# Patient Record
Sex: Male | Born: 1945 | Race: White | Hispanic: No | Marital: Married | Smoking: Former smoker
Health system: Southern US, Community
[De-identification: ages and names within clinical notes are randomized; demographics above are authoritative.]

## PROBLEM LIST (undated history)

## (undated) DIAGNOSIS — F0280 Dementia in other diseases classified elsewhere without behavioral disturbance: Secondary | ICD-10-CM

## (undated) DIAGNOSIS — G309 Alzheimer's disease, unspecified: Secondary | ICD-10-CM

## (undated) DIAGNOSIS — G20 Parkinson's disease: Secondary | ICD-10-CM

## (undated) DIAGNOSIS — G20A1 Parkinson's disease without dyskinesia, without mention of fluctuations: Secondary | ICD-10-CM

## (undated) DIAGNOSIS — N210 Calculus in bladder: Secondary | ICD-10-CM

## (undated) DIAGNOSIS — D649 Anemia, unspecified: Secondary | ICD-10-CM

## (undated) DIAGNOSIS — N400 Enlarged prostate without lower urinary tract symptoms: Secondary | ICD-10-CM

## (undated) DIAGNOSIS — F0390 Unspecified dementia without behavioral disturbance: Secondary | ICD-10-CM

## (undated) DIAGNOSIS — I10 Essential (primary) hypertension: Secondary | ICD-10-CM

## (undated) DIAGNOSIS — E785 Hyperlipidemia, unspecified: Secondary | ICD-10-CM

## (undated) HISTORY — PX: RECTAL SURGERY: SHX760

---

## 2012-10-31 ENCOUNTER — Emergency Department (HOSPITAL_COMMUNITY): Payer: Medicare Other

## 2012-10-31 ENCOUNTER — Encounter (HOSPITAL_COMMUNITY): Payer: Self-pay

## 2012-10-31 ENCOUNTER — Emergency Department (HOSPITAL_COMMUNITY)
Admission: EM | Admit: 2012-10-31 | Discharge: 2012-10-31 | Disposition: A | Discharge status: Home / Self Care | Payer: Medicare Other | Attending: Emergency Medicine | Admitting: Emergency Medicine

## 2012-10-31 DIAGNOSIS — I10 Essential (primary) hypertension: Secondary | ICD-10-CM | POA: Insufficient documentation

## 2012-10-31 DIAGNOSIS — F0280 Dementia in other diseases classified elsewhere without behavioral disturbance: Secondary | ICD-10-CM | POA: Insufficient documentation

## 2012-10-31 DIAGNOSIS — Z23 Encounter for immunization: Secondary | ICD-10-CM | POA: Insufficient documentation

## 2012-10-31 DIAGNOSIS — S0100XA Unspecified open wound of scalp, initial encounter: Secondary | ICD-10-CM | POA: Insufficient documentation

## 2012-10-31 DIAGNOSIS — Z862 Personal history of diseases of the blood and blood-forming organs and certain disorders involving the immune mechanism: Secondary | ICD-10-CM | POA: Insufficient documentation

## 2012-10-31 DIAGNOSIS — W1809XA Striking against other object with subsequent fall, initial encounter: Secondary | ICD-10-CM

## 2012-10-31 DIAGNOSIS — S0101XA Laceration without foreign body of scalp, initial encounter: Secondary | ICD-10-CM

## 2012-10-31 DIAGNOSIS — W06XXXA Fall from bed, initial encounter: Secondary | ICD-10-CM | POA: Insufficient documentation

## 2012-10-31 DIAGNOSIS — Y929 Unspecified place or not applicable: Secondary | ICD-10-CM | POA: Insufficient documentation

## 2012-10-31 DIAGNOSIS — Y939 Activity, unspecified: Secondary | ICD-10-CM | POA: Insufficient documentation

## 2012-10-31 DIAGNOSIS — Z87448 Personal history of other diseases of urinary system: Secondary | ICD-10-CM | POA: Insufficient documentation

## 2012-10-31 DIAGNOSIS — Z8639 Personal history of other endocrine, nutritional and metabolic disease: Secondary | ICD-10-CM | POA: Insufficient documentation

## 2012-10-31 DIAGNOSIS — G309 Alzheimer's disease, unspecified: Secondary | ICD-10-CM | POA: Insufficient documentation

## 2012-10-31 HISTORY — DX: Essential (primary) hypertension: I10

## 2012-10-31 HISTORY — DX: Benign prostatic hyperplasia without lower urinary tract symptoms: N40.0

## 2012-10-31 HISTORY — DX: Alzheimer's disease, unspecified: G30.9

## 2012-10-31 HISTORY — DX: Dementia in other diseases classified elsewhere, unspecified severity, without behavioral disturbance, psychotic disturbance, mood disturbance, and anxiety: F02.80

## 2012-10-31 HISTORY — DX: Hyperlipidemia, unspecified: E78.5

## 2012-10-31 HISTORY — DX: Unspecified dementia, unspecified severity, without behavioral disturbance, psychotic disturbance, mood disturbance, and anxiety: F03.90

## 2012-10-31 MED ORDER — LIDOCAINE HCL (PF) 1 % IJ SOLN
10.0000 mL | Freq: Once | INTRAMUSCULAR | Status: AC
  Administered 2012-10-31: 10 mL via INTRADERMAL
  Filled 2012-10-31 (×2): qty 5

## 2012-10-31 MED ORDER — ACETAMINOPHEN 325 MG PO TABS
650.0000 mg | ORAL_TABLET | Freq: Once | ORAL | Status: AC
  Administered 2012-10-31: 650 mg via ORAL
  Filled 2012-10-31: qty 2

## 2012-10-31 MED ORDER — BACITRACIN 500 UNIT/GM EX OINT
1.0000 "application " | TOPICAL_OINTMENT | Freq: Two times a day (BID) | CUTANEOUS | Status: DC
  Administered 2012-10-31: 1 via TOPICAL
  Filled 2012-10-31 (×5): qty 0.9

## 2012-10-31 MED ORDER — TETANUS-DIPHTH-ACELL PERTUSSIS 5-2.5-18.5 LF-MCG/0.5 IM SUSP
0.5000 mL | Freq: Once | INTRAMUSCULAR | Status: AC
  Administered 2012-10-31: 0.5 mL via INTRAMUSCULAR
  Filled 2012-10-31: qty 0.5

## 2012-10-31 NOTE — ED Notes (Signed)
Laceration to forehead, cleaned with saline, wrapped with 4X4 and gauze, pt tolerated well.

## 2012-10-31 NOTE — ED Notes (Signed)
Pt presents to er with family with c/o fall, laceration to left posterior head area, bleeding controlled at present, pt is a resident of [facility], wife states that she was told by staff members that pt was found walking out of the door of his room, staff found blood stain on night dresser, thinks that pt may have rolled out of bed, pt has hx of alzheimer's. pt alert to baseline, verified with family at bedside, .

## 2012-10-31 NOTE — ED Notes (Signed)
Pt a resident of [facility] in [place], wife was told that pt fell out of bed, pt denies any loc, pt stated he got tangled in the sheets.

## 2012-10-31 NOTE — ED Notes (Signed)
Tammy PA at bedside,  

## 2012-10-31 NOTE — ED Provider Notes (Signed)
History     CSN: [id number]  Arrival date & time 10/31/12  1533   First MD Initiated Contact with Patient 10/31/12 [phone]      Chief Complaint  Patient presents with  . Head Laceration  . Fall    (Consider location/radiation/quality/duration/timing/severity/associated sxs/prior treatment) HPI Comments: Level 5 caveat applied  Patient with hx of dementia and Alzheimer's and resides at an assisted living facility, c/o laceration to his left scalp after falling out of bed this morning. Patient is unsure of what he hit his head on, patient's wife states that she was informed by the facility staff, that the staff noticed him standing outside his room and blood on his scalp.  His wife states that he has been answering questions appropriately and ambulating without difficulty since the accident.  She states that he "is acting normal for him".  Patient denies headache, dizziness, neck pain, vomiting, visual changes, back pain, hip pain or other injuries.   Wife is unsure of last TDaP.  She states that he answers questions appropriately, but most answers are "yes" and "no".    Patient is a 67 y.o. male presenting with scalp laceration. The history is provided by the patient and the spouse. History limited by: hx of dementia, alzheimer's.  Head Laceration This is a new problem. The current episode started today. The problem occurs constantly. The problem has been unchanged. Nothing aggravates the symptoms. He has tried nothing for the symptoms. The treatment provided no relief.    Past Medical History  Diagnosis Date  . Alzheimer disease   . Hypertension   . Dementia   . BPH (benign prostatic hyperplasia)   . HLD (hyperlipidemia)     Past Surgical History  Procedure Laterality Date  . Rectal surgery      No family history on file.  History  Substance Use Topics  . Smoking status: Never Smoker   . Smokeless tobacco: Not on file  . Alcohol Use: No      Review of Systems  Unable  to perform ROS Due to level 5 caveat  Allergies  Review of patient's allergies indicates not on file.  Home Medications  No current outpatient prescriptions on file.  BP 153/77  Pulse 75  Temp(Src) 98.9 F (37.2 C) (Oral)  Resp 17  Ht 5\' 7"  (1.702 m)  Wt 120 lb (54.432 kg)  BMI 18.79 kg/m2  SpO2 99%  Physical Exam  Nursing note and vitals reviewed. Constitutional: He is oriented to person, place, and time. He appears well-developed and well-nourished. No distress.  HENT:  Head:    Eyes: Conjunctivae and EOM are normal. Pupils are equal, round, and reactive to light.  Neck: Normal range of motion. Neck supple.  Cardiovascular: Normal rate, regular rhythm, normal heart sounds and intact distal pulses.   No murmur heard. Pulmonary/Chest: Effort normal and breath sounds normal. No respiratory distress. He exhibits no tenderness.  Abdominal: Soft. He exhibits no distension and no mass. There is no tenderness. There is no rebound and no guarding.  Musculoskeletal: Normal range of motion. He exhibits no edema and no tenderness.  Lymphadenopathy:    He has no cervical adenopathy.  Neurological: He is alert and oriented to person, place, and time. He has normal strength. No cranial nerve deficit or sensory deficit. He exhibits normal muscle tone. Coordination and gait normal.  Skin: Skin is warm and dry.  Psychiatric: He has a normal mood and affect.    ED Course  Procedures (including critical  care time)  Labs Reviewed - No data to display No results found.   Ct Head Wo Contrast  10/31/2012  *RADIOLOGY REPORT*  Clinical Data: Dementia.  High blood pressure.  Fall.  CT HEAD WITHOUT CONTRAST  Technique:  Contiguous axial images were obtained from the base of the skull through the vertex without contrast.  Comparison: None.  Findings: No skull fracture or intracranial hemorrhage.  Global atrophy.  Ventricular prominence I suspect is related to atrophy.  Difficult to completely  exclude a mild component hydrocephalus.  Small vessel disease type changes without CT evidence of large acute infarct.  No intracranial mass lesion detected on this unenhanced exam.  Left maxillary sinus and left sphenoid sinus mucosal thickening.  IMPRESSION: No skull fracture or intracranial hemorrhage.  Global atrophy.  Ventricular prominence I suspect is related to atrophy.  Difficult to completely exclude a mild component hydrocephalus.  Left maxillary sinus and left sphenoid sinus mucosal thickening.   Original Report Authenticated By: Lacy Duverney, M.D.      MDM    LACERATION REPAIR Performed by: Flemon Kelty L. Authorized by: Maxwell Caul Consent: Verbal consent obtained. Risks and benefits: risks, benefits and alternatives were discussed Consent given by: patient Patient identity confirmed: provided demographic data Prepped and Draped in normal sterile fashion Wound explored  Laceration Location: left scalp  Laceration Length: 4 cm  No Foreign Bodies seen or palpated  Anesthesia: local infiltration  Local anesthetic: lidocaine 1% w/o epinephrine  Anesthetic total: 4 ml  Irrigation method: syringe Amount of cleaning: standard  Skin closure:staples Number of staples: 7   Patient tolerance: Patient tolerated the procedure well with no immediate complications.    Patient remains alert, moving all extremities w/o difficulty.  At his neurological baseline per his wife.     Patient seen by EDP, Dr. Adriana Simas prior to d/c.  Spouse agrees to close f/u with his PMD or to return here if needed.    The patient appears reasonably screened and/or stabilized for discharge and I doubt any other medical condition or other [facility] requiring further screening, evaluation, or treatment in the ED at this time prior to discharge.   Kate Sweetman L. Trisha Mangle, PA-C 11/03/12 1605

## 2012-10-31 NOTE — ED Notes (Addendum)
Pt given drink and crackers  

## 2012-11-07 NOTE — ED Provider Notes (Signed)
Medical screening examination/treatment/procedure(s) were conducted as a shared visit with non-physician practitioner(s) and myself.  I personally evaluated the patient during the encounter.  5 caveat for dementia. No new neurological deficits. Laceration repair by Pauline Aus PA  Donnetta Hutching, MD 11/07/12 [phone]

## 2013-08-04 ENCOUNTER — Encounter (HOSPITAL_COMMUNITY): Payer: Self-pay | Admitting: Emergency Medicine

## 2013-08-04 ENCOUNTER — Emergency Department (HOSPITAL_COMMUNITY): Payer: Medicare Other

## 2013-08-04 ENCOUNTER — Emergency Department (HOSPITAL_COMMUNITY)
Admission: EM | Admit: 2013-08-04 | Discharge: 2013-08-04 | Disposition: A | Discharge status: Home / Self Care | Payer: Medicare Other | Attending: Emergency Medicine | Admitting: Emergency Medicine

## 2013-08-04 DIAGNOSIS — H5789 Other specified disorders of eye and adnexa: Secondary | ICD-10-CM | POA: Insufficient documentation

## 2013-08-04 DIAGNOSIS — S0083XA Contusion of other part of head, initial encounter: Secondary | ICD-10-CM

## 2013-08-04 DIAGNOSIS — IMO0002 Reserved for concepts with insufficient information to code with codable children: Secondary | ICD-10-CM | POA: Insufficient documentation

## 2013-08-04 DIAGNOSIS — Z7982 Long term (current) use of aspirin: Secondary | ICD-10-CM | POA: Insufficient documentation

## 2013-08-04 DIAGNOSIS — R000 Tachycardia, unspecified: Secondary | ICD-10-CM | POA: Insufficient documentation

## 2013-08-04 DIAGNOSIS — Z8639 Personal history of other endocrine, nutritional and metabolic disease: Secondary | ICD-10-CM | POA: Insufficient documentation

## 2013-08-04 DIAGNOSIS — G309 Alzheimer's disease, unspecified: Secondary | ICD-10-CM | POA: Insufficient documentation

## 2013-08-04 DIAGNOSIS — Z79899 Other long term (current) drug therapy: Secondary | ICD-10-CM | POA: Insufficient documentation

## 2013-08-04 DIAGNOSIS — S0003XA Contusion of scalp, initial encounter: Secondary | ICD-10-CM | POA: Insufficient documentation

## 2013-08-04 DIAGNOSIS — F0280 Dementia in other diseases classified elsewhere without behavioral disturbance: Secondary | ICD-10-CM | POA: Insufficient documentation

## 2013-08-04 DIAGNOSIS — W19XXXA Unspecified fall, initial encounter: Secondary | ICD-10-CM

## 2013-08-04 DIAGNOSIS — Y92009 Unspecified place in unspecified non-institutional (private) residence as the place of occurrence of the external cause: Secondary | ICD-10-CM | POA: Insufficient documentation

## 2013-08-04 DIAGNOSIS — R296 Repeated falls: Secondary | ICD-10-CM | POA: Insufficient documentation

## 2013-08-04 DIAGNOSIS — Z87448 Personal history of other diseases of urinary system: Secondary | ICD-10-CM | POA: Insufficient documentation

## 2013-08-04 DIAGNOSIS — I10 Essential (primary) hypertension: Secondary | ICD-10-CM | POA: Insufficient documentation

## 2013-08-04 DIAGNOSIS — S1093XA Contusion of unspecified part of neck, initial encounter: Principal | ICD-10-CM

## 2013-08-04 DIAGNOSIS — Z862 Personal history of diseases of the blood and blood-forming organs and certain disorders involving the immune mechanism: Secondary | ICD-10-CM | POA: Insufficient documentation

## 2013-08-04 DIAGNOSIS — Y939 Activity, unspecified: Secondary | ICD-10-CM | POA: Insufficient documentation

## 2013-08-04 LAB — BASIC METABOLIC PANEL
BUN: 31 mg/dL — ABNORMAL HIGH (ref 6–23)
CO2: 28 meq/L (ref 19–32)
Calcium: 10.3 mg/dL (ref 8.4–10.5)
Chloride: 101 mEq/L (ref 96–112)
Creatinine, Ser: 1.21 mg/dL (ref 0.50–1.35)
GFR calc Af Amer: 70 mL/min — ABNORMAL LOW (ref >90)
GFR calc non Af Amer: 60 mL/min — ABNORMAL LOW (ref >90)
GLUCOSE: 124 mg/dL — AB (ref 70–99)
POTASSIUM: 3.9 meq/L (ref 3.7–5.3)
SODIUM: 143 meq/L (ref 137–147)

## 2013-08-04 LAB — URINALYSIS, ROUTINE W REFLEX MICROSCOPIC
Bilirubin Urine: NEGATIVE
GLUCOSE, UA: NEGATIVE mg/dL
HGB URINE DIPSTICK: NEGATIVE
Leukocytes, UA: NEGATIVE
Nitrite: NEGATIVE
PH: 5 (ref 5.0–8.0)
Protein, ur: NEGATIVE mg/dL
UROBILINOGEN UA: 0.2 mg/dL (ref 0.0–1.0)

## 2013-08-04 LAB — CBC
HEMATOCRIT: 42.9 % (ref 39.0–52.0)
HEMOGLOBIN: 16 g/dL (ref 13.0–17.0)
MCH: 32.5 pg (ref 26.0–34.0)
MCHC: 37.3 g/dL — ABNORMAL HIGH (ref 30.0–36.0)
MCV: 87.2 fL (ref 78.0–100.0)
Platelets: 95 10*3/uL — ABNORMAL LOW (ref 150–400)
RBC: 4.92 MIL/uL (ref 4.22–5.81)
RDW: 14.3 % (ref 11.5–15.5)
WBC: 11.1 10*3/uL — ABNORMAL HIGH (ref 4.0–10.5)

## 2013-08-04 LAB — POCT I-STAT TROPONIN I: TROPONIN I, POC: 0.01 ng/mL (ref 0.00–0.08)

## 2013-08-04 MED ORDER — DOUBLE ANTIBIOTIC 500-10000 UNIT/GM EX OINT
TOPICAL_OINTMENT | CUTANEOUS | Status: AC
  Filled 2013-08-04: qty 1

## 2013-08-04 MED ORDER — DOUBLE ANTIBIOTIC 500-10000 UNIT/GM EX OINT
TOPICAL_OINTMENT | Freq: Once | CUTANEOUS | Status: AC
  Administered 2013-08-04: 1 via TOPICAL

## 2013-08-04 NOTE — ED Provider Notes (Signed)
Medical screening examination/treatment/procedure(s) were conducted as a shared visit with non-physician practitioner(s) and myself.  I personally evaluated the patient during the encounter.  EKG Interpretation    Date/Time:  Thursday August 04 2013 12:06:30 EST Ventricular Rate:  110 PR Interval:  152 QRS Duration: 76 QT Interval:  320 QTC Calculation: 433 R Axis:   86 Text Interpretation:  Sinus tachycardia Otherwise normal ECG No previous ECGs available Confirmed by Bebe ShaggyWICKLINE  MD, Marcanthony Sleight [phone]) on 08/04/2013 12:12:45 PM             Pt without any signs of traumatic extremity/spinal injury Stable for d/c Suspect mechanical fall   Joya Gaskinsonald W Allessandra Bernardi, MD 08/04/13 [phone]

## 2013-08-04 NOTE — ED Notes (Signed)
[facility] and facial injury

## 2013-08-04 NOTE — ED Provider Notes (Signed)
CSN: [id number]     Arrival date & time 08/04/13  1058 History   First MD Initiated Contact with Patient 08/04/13 1145     Chief Complaint  Patient presents with  . Fall   (Consider location/radiation/quality/duration/timing/severity/associated sxs/prior Treatment) HPI Comments: Patient is a 68 year old male with a past medical history of Alzheimer's dementia and hypertension who presents to the emergency department from [facility] after a fall. Patient had an unwitnessed fall this morning, unsure at what time, he was found on the floor by the assisted living staff conscious. It was noted that he had abrasions to his face. Wife states patient is at his baseline mentation. Patient unable to provide any information to the history, level V caveat due to dementia.  Patient is a 68 y.o. male presenting with fall. The history is provided by the spouse.  Fall    Past Medical History  Diagnosis Date  . Alzheimer disease   . Hypertension   . Dementia   . BPH (benign prostatic hyperplasia)   . HLD (hyperlipidemia)    Past Surgical History  Procedure Laterality Date  . Rectal surgery     No family history on file. History  Substance Use Topics  . Smoking status: Never Smoker   . Smokeless tobacco: Not on file  . Alcohol Use: No    Review of Systems  Unable to perform ROS: Dementia    Allergies  Review of patient's allergies indicates no known allergies.  Home Medications   Current Outpatient Rx  Name  Route  Sig  Dispense  Refill  . ALPRAZolam (XANAX) 0.5 MG tablet   Oral   Take 0.5 mg by mouth 2 (two) times daily.          Marland Kitchen. aspirin EC 81 MG tablet   Oral   Take 81 mg by mouth daily.         . B Complex-Biotin-FA (B-COMPLEX PO)   Oral   Take 1 tablet by mouth daily.         . busPIRone (BUSPAR) 15 MG tablet   Oral   Take 15 mg by mouth 2 (two) times daily.         . carbidopa-levodopa (SINEMET IR) 25-100 MG per tablet   Oral   Take 1 tablet by mouth 3  (three) times daily.         Marland Kitchen. galantamine (RAZADYNE) 4 MG tablet   Oral   Take 4 mg by mouth 2 (two) times daily.         . memantine (NAMENDA) 10 MG tablet   Oral   Take 20 mg by mouth daily.         . mirtazapine (REMERON) 15 MG tablet   Oral   Take 15 mg by mouth at bedtime.         Marland Kitchen. OLANZapine (ZYPREXA) 5 MG tablet   Oral   Take 5 mg by mouth daily.         . Omega-3 Fatty Acids (FISH OIL) 1200 MG CAPS   Oral   Take 1 capsule by mouth daily.         . sertraline (ZOLOFT) 50 MG tablet   Oral   Take 50 mg by mouth daily.         . vitamin C (ASCORBIC ACID) 500 MG tablet   Oral   Take 500 mg by mouth daily.         . vitamin E 400 UNIT capsule  Oral   Take 400 Units by mouth daily.          BP 126/79  Pulse 109  Temp(Src) 99.3 F (37.4 C) (Oral)  Resp 18  SpO2 98% Physical Exam  Nursing note and vitals reviewed. Constitutional: He appears well-developed and well-nourished. No distress.  HENT:  Head: Normocephalic. Head is without raccoon's eyes and without Battle's sign.    Right Ear: No hemotympanum.  Left Ear: No hemotympanum.  Nose: Nose normal.  Mouth/Throat: Oropharynx is clear and moist.  Multiple facial abrasions, tender.  Eyes: Conjunctivae are normal. Right eye exhibits discharge (white/clear). Left eye exhibits discharge (white/clear).  Neck: Normal range of motion. Neck supple.  Cardiovascular: Regular rhythm, normal heart sounds and intact distal pulses.  Tachycardia present.   No extremity edema.  Pulmonary/Chest: Effort normal and breath sounds normal.  Abdominal: Soft. Bowel sounds are normal. He exhibits no distension. There is no tenderness.  Musculoskeletal: Normal range of motion. He exhibits no edema.  Neurological: He is alert.  Skin: Skin is warm and dry. He is not diaphoretic.  Psychiatric: He has a normal mood and affect. His behavior is normal.    ED Course  Procedures (including critical care time) Labs  Review Labs Reviewed  CBC - Abnormal; Notable for the following:    WBC 11.1 (*)    MCHC 37.3 (*)    Platelets 95 (*)    All other components within normal limits  BASIC METABOLIC PANEL - Abnormal; Notable for the following:    Glucose, Bld 124 (*)    BUN 31 (*)    GFR calc non Af Amer 60 (*)    GFR calc Af Amer 70 (*)    All other components within normal limits  URINALYSIS, ROUTINE W REFLEX MICROSCOPIC - Abnormal; Notable for the following:    Specific Gravity, Urine >1.030 (*)    Ketones, ur TRACE (*)    All other components within normal limits  POCT I-STAT TROPONIN I   Imaging Review Dg Chest 1 View  08/04/2013   CLINICAL DATA:  Hypertension.  Status post fall.  EXAM: CHEST - 1 VIEW  COMPARISON:  None.  FINDINGS: The heart size and mediastinal contours are within normal limits. There is no focal infiltrate, pulmonary edema, or pleural effusion. 5 mm calcified granuloma is identified in the right mid lung. There is scoliosis of spine. The visualized bony structures otherwise unremarkable.  IMPRESSION: No active cardiopulmonary disease.   Electronically Signed   By: Sherian Rein M.D.   On: 08/04/2013 12:42   Ct Head Wo Contrast  08/04/2013   CLINICAL DATA:  Fall  EXAM: CT HEAD WITHOUT CONTRAST  CT MAXILLOFACIAL WITHOUT CONTRAST  CT CERVICAL SPINE WITHOUT CONTRAST  TECHNIQUE: Multidetector CT imaging of the head, cervical spine, and maxillofacial structures were performed using the standard protocol without intravenous contrast. Multiplanar CT image reconstructions of the cervical spine and maxillofacial structures were also generated.  COMPARISON:  CT HEAD W/O CM dated 10/31/2012  FINDINGS: CT HEAD FINDINGS  Global atrophy. Chronic ischemic changes in the periventricular white matter are mild. No mass effect, midline shift come off or acute intracranial hemorrhage. Cranium is intact.  CT MAXILLOFACIAL FINDINGS  Lytic bone lesion at the anterior right middle cranial fossa is stable. No acute  fracture. No dislocation. Periapical lucency surrounding a left upper molar is noted compatible with periodontal disease. Mastoid air cells are clear. Paranasal sinuses are clear. No evidence of orbital or vitreous hemorrhage. Nasal septum is  deviated to the right, a chronic finding.  CT CERVICAL SPINE FINDINGS  No acute fracture.  No dislocation.  No obvious soft tissue injury. Thyroid is heterogeneous without focal mass. Lung apices clear.  Severe disc space narrowing at C4-5 with moderate right foraminal narrowing.  Posterior osteophytic ridging at C5-6 causes right-sided central stenosis and mild right foraminal narrowing.  C6-7: Posterior osteophytes and severe disc space narrowing without foraminal or central narrowing.  IMPRESSION: No acute intracranial pathology.  No evidence of facial bone fracture.  No acute cervical spine injury.  Degenerative changes are noted.   Electronically Signed   By: Maryclare Bean M.D.   On: 08/04/2013 13:05   Ct Cervical Spine Wo Contrast  08/04/2013   CLINICAL DATA:  Fall  EXAM: CT HEAD WITHOUT CONTRAST  CT MAXILLOFACIAL WITHOUT CONTRAST  CT CERVICAL SPINE WITHOUT CONTRAST  TECHNIQUE: Multidetector CT imaging of the head, cervical spine, and maxillofacial structures were performed using the standard protocol without intravenous contrast. Multiplanar CT image reconstructions of the cervical spine and maxillofacial structures were also generated.  COMPARISON:  CT HEAD W/O CM dated 10/31/2012  FINDINGS: CT HEAD FINDINGS  Global atrophy. Chronic ischemic changes in the periventricular white matter are mild. No mass effect, midline shift come off or acute intracranial hemorrhage. Cranium is intact.  CT MAXILLOFACIAL FINDINGS  Lytic bone lesion at the anterior right middle cranial fossa is stable. No acute fracture. No dislocation. Periapical lucency surrounding a left upper molar is noted compatible with periodontal disease. Mastoid air cells are clear. Paranasal sinuses are clear. No  evidence of orbital or vitreous hemorrhage. Nasal septum is deviated to the right, a chronic finding.  CT CERVICAL SPINE FINDINGS  No acute fracture.  No dislocation.  No obvious soft tissue injury. Thyroid is heterogeneous without focal mass. Lung apices clear.  Severe disc space narrowing at C4-5 with moderate right foraminal narrowing.  Posterior osteophytic ridging at C5-6 causes right-sided central stenosis and mild right foraminal narrowing.  C6-7: Posterior osteophytes and severe disc space narrowing without foraminal or central narrowing.  IMPRESSION: No acute intracranial pathology.  No evidence of facial bone fracture.  No acute cervical spine injury.  Degenerative changes are noted.   Electronically Signed   By: Maryclare Bean M.D.   On: 08/04/2013 13:05   Ct Maxillofacial Wo Cm  08/04/2013   CLINICAL DATA:  Fall  EXAM: CT HEAD WITHOUT CONTRAST  CT MAXILLOFACIAL WITHOUT CONTRAST  CT CERVICAL SPINE WITHOUT CONTRAST  TECHNIQUE: Multidetector CT imaging of the head, cervical spine, and maxillofacial structures were performed using the standard protocol without intravenous contrast. Multiplanar CT image reconstructions of the cervical spine and maxillofacial structures were also generated.  COMPARISON:  CT HEAD W/O CM dated 10/31/2012  FINDINGS: CT HEAD FINDINGS  Global atrophy. Chronic ischemic changes in the periventricular white matter are mild. No mass effect, midline shift come off or acute intracranial hemorrhage. Cranium is intact.  CT MAXILLOFACIAL FINDINGS  Lytic bone lesion at the anterior right middle cranial fossa is stable. No acute fracture. No dislocation. Periapical lucency surrounding a left upper molar is noted compatible with periodontal disease. Mastoid air cells are clear. Paranasal sinuses are clear. No evidence of orbital or vitreous hemorrhage. Nasal septum is deviated to the right, a chronic finding.  CT CERVICAL SPINE FINDINGS  No acute fracture.  No dislocation.  No obvious soft tissue  injury. Thyroid is heterogeneous without focal mass. Lung apices clear.  Severe disc space narrowing at C4-5 with moderate  right foraminal narrowing.  Posterior osteophytic ridging at C5-6 causes right-sided central stenosis and mild right foraminal narrowing.  C6-7: Posterior osteophytes and severe disc space narrowing without foraminal or central narrowing.  IMPRESSION: No acute intracranial pathology.  No evidence of facial bone fracture.  No acute cervical spine injury.  Degenerative changes are noted.   Electronically Signed   By: Maryclare Bean M.D.   On: 08/04/2013 13:05    EKG Interpretation    Date/Time:  Thursday August 04 2013 12:06:30 EST Ventricular Rate:  110 PR Interval:  152 QRS Duration: 76 QT Interval:  320 QTC Calculation: 433 R Axis:   86 Text Interpretation:  Sinus tachycardia Otherwise normal ECG No previous ECGs available Confirmed by Bebe Shaggy  MD, DONALD [phone]) on 08/04/2013 12:12:45 PM            MDM   1. Fall   2. Facial contusion     Pt presenting after un-witnessed fall. He appears in NAD. At baseline mentation per wife. CT scan C-spine, head and maxillofacial obtained, no acute findings. Chest x-ray without any acute findings. EKG showing sinus tachycardia, otherwise normal. Labs without any significant findings. No signs of infection. Patient is stable for discharge back to assisted living facility. Case discussed with attending Dr. Bebe Shaggy who also evaluated patient and agrees with plan of care.   Trevor Mace, PA-C 08/04/13 [phone]

## 2013-08-04 NOTE — Discharge Instructions (Signed)
Facial or Scalp Contusion °A facial or scalp contusion is a deep bruise on the face or head. Injuries to the face and head generally cause a lot of swelling, especially around the eyes. Contusions are the result of an injury that caused bleeding under the skin. The contusion may turn blue, purple, or yellow. Minor injuries will give you a painless contusion, but more severe contusions may stay painful and swollen for a few weeks.  °CAUSES  °A facial or scalp contusion is caused by a blunt injury or trauma to the face or head area.  °SIGNS AND SYMPTOMS  °· Swelling of the injured area.   °· Discoloration of the injured area.   °· Tenderness, soreness, or pain in the injured area.   °DIAGNOSIS  °The diagnosis can be made by taking a medical history and doing a physical exam. An X-ray exam, CT scan, or MRI may be needed to determine if there are any associated injuries, such as broken bones (fractures). °TREATMENT  °Often, the best treatment for a facial or scalp contusion is applying cold compresses to the injured area. Over-the-counter medicines may also be recommended for pain control.  °HOME CARE INSTRUCTIONS  °· Only take over-the-counter or prescription medicines as directed by your health care provider.   °· Apply ice to the injured area.   °· Put ice in a plastic bag.   °· Place a towel between your skin and the bag.   °· Leave the ice on for 20 minutes, 2 3 times a day.   °SEEK MEDICAL CARE IF: °· You have bite problems.   °· You have pain with chewing.   °· You are concerned about facial defects. °SEEK IMMEDIATE MEDICAL CARE IF: °· You have severe pain or a headache that is not relieved by medicine.   °· You have unusual sleepiness, confusion, or personality changes.   °· You throw up (vomit).   °· You have a persistent nosebleed.   °· You have double vision or blurred vision.   °· You have fluid drainage from your nose or ear.   °· You have difficulty walking or using your arms or legs.   °MAKE SURE YOU:   °· Understand these instructions. °· Will watch your condition. °· Will get help right away if you are not doing well or get worse. °Document Released: 07/24/2004 Document Revised: 04/06/2013 Document Reviewed: 01/27/2013 °Exit[employer]® Patient Information ©2014 [employer]. ° °Fall Prevention and Home Safety °Falls cause injuries and can affect all age groups. It is possible to use preventive measures to significantly decrease the likelihood of falls. There are many simple measures which can make your home safer and prevent falls. °OUTDOORS °· Repair cracks and edges of walkways and driveways. °· Remove high doorway thresholds. °· Trim shrubbery on the main path into your home. °· Have good outside lighting. °· Clear walkways of tools, rocks, debris, and clutter. °· Check that handrails are not broken and are securely fastened. Both sides of steps should have handrails. °· Have leaves, snow, and ice cleared regularly. °· Use sand or salt on walkways during winter months. °· In the garage, clean up grease or oil spills. °BATHROOM °· Install night lights. °· Install grab bars by the toilet and in the tub and shower. °· Use non-skid mats or decals in the tub or shower. °· Place a plastic non-slip stool in the shower to sit on, if needed. °· Keep floors dry and clean up all water on the floor immediately. °· Remove soap buildup in the tub or shower on a regular basis. °· Secure bath   mats with non-slip, double-sided rug tape.  Remove throw rugs and tripping hazards from the floors. BEDROOMS  Install night lights.  Make sure a bedside light is easy to reach.  Do not use oversized bedding.  Keep a telephone by your bedside.  Have a firm chair with side arms to use for getting dressed.  Remove throw rugs and tripping hazards from the floor. KITCHEN  Keep handles on pots and pans turned toward the center of the stove. Use back burners when possible.  Clean up spills quickly and allow time for  drying.  Avoid walking on wet floors.  Avoid hot utensils and knives.  Position shelves so they are not too high or low.  Place commonly used objects within easy reach.  If necessary, use a sturdy step stool with a grab bar when reaching.  Keep electrical cables out of the way.  Do not use floor polish or wax that makes floors slippery. If you must use wax, use non-skid floor wax.  Remove throw rugs and tripping hazards from the floor. STAIRWAYS  Never leave objects on stairs.  Place handrails on both sides of stairways and use them. Fix any loose handrails. Make sure handrails on both sides of the stairways are as long as the stairs.  Check carpeting to make sure it is firmly attached along stairs. Make repairs to worn or loose carpet promptly.  Avoid placing throw rugs at the top or bottom of stairways, or properly secure the rug with carpet tape to prevent slippage. Get rid of throw rugs, if possible.  Have an electrician put in a light switch at the top and bottom of the stairs. OTHER FALL PREVENTION TIPS  Wear low-heel or rubber-soled shoes that are supportive and fit well. Wear closed toe shoes.  When using a stepladder, make sure it is fully opened and both spreaders are firmly locked. Do not climb a closed stepladder.  Add color or contrast paint or tape to grab bars and handrails in your home. Place contrasting color strips on first and last steps.  Learn and use mobility aids as needed. Install an electrical emergency response system.  Turn on lights to avoid dark areas. Replace light bulbs that burn out immediately. Get light switches that glow.  Arrange furniture to create clear pathways. Keep furniture in the same place.  Firmly attach carpet with non-skid or double-sided tape.  Eliminate uneven floor surfaces.  Select a carpet pattern that does not visually hide the edge of steps.  Be aware of all pets. OTHER HOME SAFETY TIPS  Set the water temperature  for 120 F (48.8 C).  Keep emergency numbers on or near the telephone.  Keep smoke detectors on every level of the home and near sleeping areas. Document Released: 06/06/2002 Document Revised: 12/16/2011 Document Reviewed: 09/05/2011 [facility] Patient Information 2014 [employer] [place].  Fall Prevention in Hospitals As a hospital patient, your condition and the treatments you receive can increase your risk for falls. Some additional risk factors for falls in a hospital include:  Being in an unfamiliar environment.  Being on bed rest.  Your surgery.  Taking certain medicines.  Your tubing requirements, such as intravenous (IV) therapy or catheters. It is important that you learn how to decrease fall risks while at the hospital. Below are important tips that can help prevent falls. SAFETY TIPS FOR PREVENTING FALLS Talk about your risk of falling.  Ask your caregiver why you are at risk for falling. Is it your medicine, illness,  tubing placement, or something else?  Make a plan with your caregiver to keep you safe from falls.  Ask your caregiver or pharmacist about side effect of your medicines. Some medicines can make you dizzy or affect your coordination. Ask for help.  Ask for help before getting out of bed. You may need to press your call button.  Ask for assistance in getting you safely to the toilet.  Ask for a walker or cane to be put at your bedside. Ask that most of the side rails on your bed be placed up before your caregiver leaves the room.  Ask family or friends to sit with you.  Ask for things that are out of your reach, such as your glasses, hearing aids, telephone, bedside table, or call button. Follow these tips to avoid falling:  Stay lying or seated, rather than standing, while waiting for help.  Wear rubber-soled slippers or shoes whenever you walk in the hospital.  Avoid quick, sudden movements.  Change positions slowly.  Sit on the side of your bed  before standing.  Stand up slowly and wait before you start to walk.  Let your caregiver know if there is a spill on the floor.  Pay careful attention to the medical equipment, electrical cords, and tubes around you.  When you need help, use your call button by your bed or in the bathroom. Wait for one of your caregivers to help you.  If you feel dizzy or unsure of your footing, return to bed and wait for assistance.  Avoid being distracted by the TV, telephone, or another person in your room.  Do not lean or support yourself on rolling objects, such as IV poles or bedside tables. Document Released: 06/13/2000 Document Revised: 06/02/2012 Document Reviewed: 02/22/2012 [facility] Patient Information 2014 [place], [place].

## 2013-08-04 NOTE — ED Provider Notes (Signed)
Patient seen/examined in the Emergency Department in conjunction with Midlevel Provider Mathis FareAlbert Patient presents for fall Exam : awake/alert, tremor noted, no signs of extremity trauma, wife states pt at baseline Plan: stable for d/c home as no acute injury noted    Joya Gaskinsonald W Calum Cormier, MD 08/04/13 1336

## 2013-12-14 ENCOUNTER — Encounter (HOSPITAL_COMMUNITY): Payer: Self-pay | Admitting: Emergency Medicine

## 2013-12-14 ENCOUNTER — Emergency Department (HOSPITAL_COMMUNITY): Payer: Medicare Other

## 2013-12-14 ENCOUNTER — Inpatient Hospital Stay (HOSPITAL_COMMUNITY)
Admission: EM | Admit: 2013-12-14 | Discharge: 2013-12-18 | DRG: 603 | Disposition: A | Discharge status: Home Health | Payer: Medicare Other | Attending: Internal Medicine | Admitting: Internal Medicine

## 2013-12-14 DIAGNOSIS — D696 Thrombocytopenia, unspecified: Secondary | ICD-10-CM | POA: Diagnosis present

## 2013-12-14 DIAGNOSIS — E785 Hyperlipidemia, unspecified: Secondary | ICD-10-CM | POA: Diagnosis present

## 2013-12-14 DIAGNOSIS — G309 Alzheimer's disease, unspecified: Secondary | ICD-10-CM | POA: Diagnosis present

## 2013-12-14 DIAGNOSIS — K922 Gastrointestinal hemorrhage, unspecified: Secondary | ICD-10-CM

## 2013-12-14 DIAGNOSIS — D649 Anemia, unspecified: Secondary | ICD-10-CM | POA: Diagnosis present

## 2013-12-14 DIAGNOSIS — L03317 Cellulitis of buttock: Secondary | ICD-10-CM

## 2013-12-14 DIAGNOSIS — G20 Parkinson's disease: Secondary | ICD-10-CM | POA: Diagnosis present

## 2013-12-14 DIAGNOSIS — G20A1 Parkinson's disease without dyskinesia, without mention of fluctuations: Secondary | ICD-10-CM | POA: Diagnosis present

## 2013-12-14 DIAGNOSIS — Z79899 Other long term (current) drug therapy: Secondary | ICD-10-CM

## 2013-12-14 DIAGNOSIS — L0231 Cutaneous abscess of buttock: Principal | ICD-10-CM | POA: Diagnosis present

## 2013-12-14 DIAGNOSIS — A4902 Methicillin resistant Staphylococcus aureus infection, unspecified site: Secondary | ICD-10-CM | POA: Diagnosis present

## 2013-12-14 DIAGNOSIS — Z66 Do not resuscitate: Secondary | ICD-10-CM | POA: Diagnosis present

## 2013-12-14 DIAGNOSIS — Z7982 Long term (current) use of aspirin: Secondary | ICD-10-CM

## 2013-12-14 DIAGNOSIS — I10 Essential (primary) hypertension: Secondary | ICD-10-CM | POA: Diagnosis present

## 2013-12-14 DIAGNOSIS — Z87891 Personal history of nicotine dependence: Secondary | ICD-10-CM

## 2013-12-14 DIAGNOSIS — Z820 Family history of epilepsy and other diseases of the nervous system: Secondary | ICD-10-CM

## 2013-12-14 DIAGNOSIS — R195 Other fecal abnormalities: Secondary | ICD-10-CM | POA: Diagnosis present

## 2013-12-14 DIAGNOSIS — F0280 Dementia in other diseases classified elsewhere without behavioral disturbance: Secondary | ICD-10-CM | POA: Diagnosis present

## 2013-12-14 LAB — COMPREHENSIVE METABOLIC PANEL
ALK PHOS: 72 U/L (ref 39–117)
ALT: <5 U/L (ref 0–53)
AST: 20 U/L (ref 0–37)
Albumin: 3.3 g/dL — ABNORMAL LOW (ref 3.5–5.2)
BILIRUBIN TOTAL: 0.4 mg/dL (ref 0.3–1.2)
BUN: 26 mg/dL — AB (ref 6–23)
CHLORIDE: 101 meq/L (ref 96–112)
CO2: 30 mEq/L (ref 19–32)
Calcium: 9.1 mg/dL (ref 8.4–10.5)
Creatinine, Ser: 1.12 mg/dL (ref 0.50–1.35)
GFR calc non Af Amer: 66 mL/min — ABNORMAL LOW (ref >90)
GFR, EST AFRICAN AMERICAN: 77 mL/min — AB (ref >90)
GLUCOSE: 105 mg/dL — AB (ref 70–99)
POTASSIUM: 4.4 meq/L (ref 3.7–5.3)
Sodium: 142 mEq/L (ref 137–147)
Total Protein: 6.8 g/dL (ref 6.0–8.3)

## 2013-12-14 LAB — CBC WITH DIFFERENTIAL/PLATELET
Basophils Absolute: 0 10*3/uL (ref 0.0–0.1)
Basophils Relative: 0 % (ref 0–1)
Eosinophils Absolute: 0.2 10*3/uL (ref 0.0–0.7)
Eosinophils Relative: 3 % (ref 0–5)
HCT: 35.4 % — ABNORMAL LOW (ref 39.0–52.0)
HEMOGLOBIN: 12.6 g/dL — AB (ref 13.0–17.0)
Lymphocytes Relative: 18 % (ref 12–46)
Lymphs Abs: 1.1 10*3/uL (ref 0.7–4.0)
MCH: 31.6 pg (ref 26.0–34.0)
MCHC: 35.6 g/dL (ref 30.0–36.0)
MCV: 88.7 fL (ref 78.0–100.0)
MONOS PCT: 8 % (ref 3–12)
Monocytes Absolute: 0.5 10*3/uL (ref 0.1–1.0)
NEUTROS ABS: 4.4 10*3/uL (ref 1.7–7.7)
NEUTROS PCT: 71 % (ref 43–77)
Platelets: 126 10*3/uL — ABNORMAL LOW (ref 150–400)
RBC: 3.99 MIL/uL — ABNORMAL LOW (ref 4.22–5.81)
RDW: 13.7 % (ref 11.5–15.5)
WBC: 6.2 10*3/uL (ref 4.0–10.5)

## 2013-12-14 LAB — POC OCCULT BLOOD, ED: Fecal Occult Bld: POSITIVE — AB

## 2013-12-14 MED ORDER — VANCOMYCIN HCL IN DEXTROSE 1-5 GM/200ML-% IV SOLN
1000.0000 mg | INTRAVENOUS | Status: DC
  Administered 2013-12-15 – 2013-12-16 (×2): 1000 mg via INTRAVENOUS
  Filled 2013-12-14 (×4): qty 200

## 2013-12-14 MED ORDER — ALPRAZOLAM 0.5 MG PO TABS
0.5000 mg | ORAL_TABLET | Freq: Two times a day (BID) | ORAL | Status: DC
  Administered 2013-12-14 – 2013-12-18 (×8): 0.5 mg via ORAL
  Filled 2013-12-14 (×8): qty 1

## 2013-12-14 MED ORDER — OXYCODONE HCL 5 MG PO TABS
5.0000 mg | ORAL_TABLET | ORAL | Status: DC | PRN
  Administered 2013-12-15: 5 mg via ORAL
  Filled 2013-12-14: qty 1

## 2013-12-14 MED ORDER — PIPERACILLIN-TAZOBACTAM 3.375 G IVPB
3.3750 g | Freq: Three times a day (TID) | INTRAVENOUS | Status: DC
  Administered 2013-12-14 – 2013-12-17 (×8): 3.375 g via INTRAVENOUS
  Filled 2013-12-14 (×15): qty 50

## 2013-12-14 MED ORDER — PANTOPRAZOLE SODIUM 40 MG PO TBEC
40.0000 mg | DELAYED_RELEASE_TABLET | Freq: Every day | ORAL | Status: DC
  Administered 2013-12-15 – 2013-12-18 (×4): 40 mg via ORAL
  Filled 2013-12-14 (×4): qty 1

## 2013-12-14 MED ORDER — MEMANTINE HCL 10 MG PO TABS
20.0000 mg | ORAL_TABLET | Freq: Every day | ORAL | Status: DC
  Administered 2013-12-15 – 2013-12-18 (×4): 20 mg via ORAL
  Filled 2013-12-14 (×4): qty 2

## 2013-12-14 MED ORDER — ONDANSETRON HCL 4 MG PO TABS: 4.0000 mg | ORAL_TABLET | Freq: Four times a day (QID) | ORAL | Status: DC | PRN

## 2013-12-14 MED ORDER — ALBUTEROL SULFATE (2.5 MG/3ML) 0.083% IN NEBU: 2.5000 mg | INHALATION_SOLUTION | RESPIRATORY_TRACT | Status: DC | PRN

## 2013-12-14 MED ORDER — SODIUM CHLORIDE 0.9 % IV SOLN
INTRAVENOUS | Status: AC
  Administered 2013-12-14: 23:00:00 via INTRAVENOUS

## 2013-12-14 MED ORDER — GALANTAMINE HYDROBROMIDE 4 MG PO TABS
4.0000 mg | ORAL_TABLET | Freq: Two times a day (BID) | ORAL | Status: DC
  Administered 2013-12-14 – 2013-12-18 (×8): 4 mg via ORAL
  Filled 2013-12-14 (×10): qty 1

## 2013-12-14 MED ORDER — SERTRALINE HCL 50 MG PO TABS
50.0000 mg | ORAL_TABLET | Freq: Every day | ORAL | Status: DC
  Administered 2013-12-15 – 2013-12-18 (×4): 50 mg via ORAL
  Filled 2013-12-14 (×4): qty 1

## 2013-12-14 MED ORDER — ASPIRIN 81 MG PO CHEW
81.0000 mg | CHEWABLE_TABLET | Freq: Every day | ORAL | Status: DC
  Administered 2013-12-15 – 2013-12-18 (×4): 81 mg via ORAL
  Filled 2013-12-14 (×4): qty 1

## 2013-12-14 MED ORDER — VANCOMYCIN HCL IN DEXTROSE 1-5 GM/200ML-% IV SOLN
1000.0000 mg | Freq: Once | INTRAVENOUS | Status: AC
  Administered 2013-12-14: 1000 mg via INTRAVENOUS
  Filled 2013-12-14: qty 200

## 2013-12-14 MED ORDER — LIDOCAINE-EPINEPHRINE (PF) 1 %-1:200000 IJ SOLN
20.0000 mL | Freq: Once | INTRAMUSCULAR | Status: AC
  Administered 2013-12-14: 20 mL via INTRADERMAL

## 2013-12-14 MED ORDER — BUSPIRONE HCL 5 MG PO TABS
15.0000 mg | ORAL_TABLET | Freq: Two times a day (BID) | ORAL | Status: DC
  Administered 2013-12-14 – 2013-12-18 (×8): 15 mg via ORAL
  Filled 2013-12-14 (×8): qty 3

## 2013-12-14 MED ORDER — SODIUM CHLORIDE 0.9 % IV SOLN: INTRAVENOUS | Status: DC

## 2013-12-14 MED ORDER — ACETAMINOPHEN 650 MG RE SUPP: 650.0000 mg | Freq: Four times a day (QID) | RECTAL | Status: DC | PRN

## 2013-12-14 MED ORDER — MIRTAZAPINE 30 MG PO TABS
15.0000 mg | ORAL_TABLET | Freq: Every day | ORAL | Status: DC
  Administered 2013-12-14 – 2013-12-17 (×4): 15 mg via ORAL
  Filled 2013-12-14 (×4): qty 1

## 2013-12-14 MED ORDER — CIPROFLOXACIN IN D5W 400 MG/200ML IV SOLN
400.0000 mg | Freq: Once | INTRAVENOUS | Status: AC
  Administered 2013-12-14: 400 mg via INTRAVENOUS
  Filled 2013-12-14: qty 200

## 2013-12-14 MED ORDER — IOHEXOL 300 MG/ML  SOLN
100.0000 mL | Freq: Once | INTRAMUSCULAR | Status: AC | PRN
  Administered 2013-12-14: 100 mL via INTRAVENOUS

## 2013-12-14 MED ORDER — ONDANSETRON HCL 4 MG/2ML IJ SOLN: 4.0000 mg | Freq: Four times a day (QID) | INTRAMUSCULAR | Status: DC | PRN

## 2013-12-14 MED ORDER — QUETIAPINE FUMARATE 25 MG PO TABS
25.0000 mg | ORAL_TABLET | Freq: Every day | ORAL | Status: DC
  Administered 2013-12-14 – 2013-12-17 (×4): 25 mg via ORAL
  Filled 2013-12-14 (×4): qty 1

## 2013-12-14 MED ORDER — ACETAMINOPHEN 325 MG PO TABS: 650.0000 mg | ORAL_TABLET | Freq: Four times a day (QID) | ORAL | Status: DC | PRN

## 2013-12-14 MED ORDER — CARBIDOPA-LEVODOPA 25-100 MG PO TABS
1.0000 | ORAL_TABLET | Freq: Two times a day (BID) | ORAL | Status: DC
  Administered 2013-12-15 – 2013-12-18 (×7): 1 via ORAL
  Filled 2013-12-14 (×7): qty 1

## 2013-12-14 MED ORDER — PIPERACILLIN-TAZOBACTAM 3.375 G IVPB
INTRAVENOUS | Status: AC
  Filled 2013-12-14: qty 100

## 2013-12-14 NOTE — ED Notes (Signed)
[facility].

## 2013-12-14 NOTE — ED Provider Notes (Signed)
CSN: [id number]     Arrival date & time 12/14/13  1542 History  This chart was scribed for Evan OctaveStephen Rancour, Lozano by Evan Lozano, ED Scribe. The patient was seen in APA14/AP[facility]14. The patient's care was started at 3:43 PM.   Chief Complaint  Patient presents with  . Abscess   The history is provided by the spouse. The history is limited by the condition of the patient.   Level 5 Caveat. History limited to due patient's dementia. HPI Comments: Evan Lozano is a 68 y.o. male with a history of Alzheimers and Parkinson's who presents to the Emergency Department complaining of an area of pain, swelling and redness to his right upper buttocks that has been present for the past week. Patient's spouse, who accompanies him, states that boil was discovered last week [facility]. His wife states that he was started on antibiotics, but she is not sure which one.  His wife has not noticed patient with fever, vomiting, diarrhea, nausea, or abdominal pain. His wife reports that his last tetanus was within five years. He has no known allergies to medications.  Per nursing home, patient has been on Bactrim for the past 6 days.  Past Medical History  Diagnosis Date  . Alzheimer disease   . Hypertension   . Dementia   . BPH (benign prostatic hyperplasia)   . HLD (hyperlipidemia)    Past Surgical History  Procedure Laterality Date  . Rectal surgery     Family History  Problem Relation Age of Onset  . Alzheimer's disease Father    History  Substance Use Topics  . Smoking status: Never Smoker   . Smokeless tobacco: Not on file  . Alcohol Use: No    Review of Systems Level 5 caveat. Unable to complete full review of systems due to patient's dementia.    Allergies  Review of patient's allergies indicates no known allergies.  Home Medications   Prior to Admission medications   Medication Sig Start Date End Date Taking? Authorizing Evan Lozano  ALPRAZolam Prudy Feeler(XANAX) 0.5 MG  tablet Take 0.5 mg by mouth 2 (two) times daily.    Yes Historical Evan Lozano  aspirin 81 MG chewable tablet Chew 81 mg by mouth daily.   Yes Historical Evan Vespa, Lozano  B Complex-Biotin-FA (B-COMPLEX PO) Take 1 tablet by mouth daily.   Yes Historical Cynithia Hakimi, Lozano  busPIRone (BUSPAR) 15 MG tablet Take 15 mg by mouth 2 (two) times daily.   Yes Historical Makyra Corprew, Lozano  carbidopa-levodopa (SINEMET IR) 25-100 MG per tablet Take 1 tablet by mouth 2 (two) times daily.    Yes Historical Sera Hitsman, Lozano  cetirizine (ZYRTEC) 10 MG tablet Take 10 mg by mouth daily as needed for allergies.   Yes Historical Lucky Alverson, Lozano  galantamine (RAZADYNE) 4 MG tablet Take 4 mg by mouth 2 (two) times daily.   Yes Historical Mckenna Boruff, Lozano  Infant Care Products (DERMACLOUD) CREA Apply 1 application topically 3 (three) times daily as needed. rash   Yes Historical Savyon Loken, Lozano  memantine (NAMENDA) 10 MG tablet Take 20 mg by mouth daily.   Yes Historical Othal Kubitz, Lozano  mirtazapine (REMERON) 15 MG tablet Take 15 mg by mouth at bedtime.   Yes Historical Leonore Frankson, Lozano  Omega-3 Fatty Acids (FISH OIL) 1200 MG CAPS Take 1 capsule by mouth daily.   Yes Historical Gabbie Marzo, Lozano  QUEtiapine (SEROQUEL) 25 MG tablet Take 25 mg by mouth at bedtime.   Yes Historical Lakin Romer, Lozano  selenium sulfide (  ANTI-DANDRUFF) 1 % LOTN Apply 1 application topically 3 (three) times a week.   Yes Historical Desiray Orchard, Lozano  sertraline (ZOLOFT) 50 MG tablet Take 50 mg by mouth daily.   Yes Historical Seira Cody, Lozano  vitamin C (ASCORBIC ACID) 500 MG tablet Take 500 mg by mouth daily.   Yes Historical Eilleen Davoli, Lozano  vitamin E 400 UNIT capsule Take 400 Units by mouth daily.   Yes Historical Ariya Bohannon, Lozano   Triage Vitals: BP 152/81  Pulse 75  Temp(Src) 97.7 F (36.5 C) (Oral)  Resp 18  Ht 5\' 6"  (1.676 m)  Wt 153 lb (69.4 kg)  BMI 24.71 kg/m2  SpO2 99% Physical Exam  Nursing note and vitals reviewed. Constitutional: He is oriented to person, place, and time. He  appears well-developed and well-nourished. No distress.  HENT:  Head: Normocephalic and atraumatic.  Mouth/Throat: Oropharynx is clear and moist. No oropharyngeal exudate.  Eyes: Conjunctivae and EOM are normal. Pupils are equal, round, and reactive to light.  Neck: Normal range of motion. Neck supple.  No meningismus.  Cardiovascular: Normal rate, regular rhythm, normal heart sounds and intact distal pulses.   No murmur heard. Pulmonary/Chest: Effort normal and breath sounds normal. No respiratory distress.  Abdominal: Soft. There is no tenderness. There is no rebound and no guarding.  Musculoskeletal: Normal range of motion. He exhibits no edema and no tenderness.  Neurological: He is alert and oriented to person, place, and time. No cranial nerve deficit. He exhibits normal muscle tone. Coordination normal.  Parkinsonian shakes.   Skin: Skin is warm. There is erythema.  5 cm area of erythema to his right upper buttock. There are open areas of maceration that are leaking purulent fluid. It does not involve his rectum or anus.   Psychiatric: He has a normal mood and affect. His behavior is normal.    ED Course  Procedures (including critical care time) DIAGNOSTIC STUDIES: Oxygen Saturation is 99% on room air, normal by my interpretation.    COORDINATION OF CARE: 4:03 PM-Performed I&D of abscess to right buttock. Minimal amount of purulent drainage released. Wound packed and advised spouse of home care. Will find out which antibiotics patient has been taking. Discussed treatment plan which includes labs with ptbedside and pt agreed to plan.   INCISION AND DRAINAGE PROCEDURE NOTE: Patient identification was confirmed and verbal consent was obtained. This procedure was performed by Evan OctaveStephen Rancour, Lozano at 3:51 PM. Site: right upper buttock Sterile procedures observed Needle size: 21 gauge Anesthetic used (type and amt): lidocaine 1% with epi Blade size: 11  Drainage: Minimal amount of  purulent drainage.  Complexity: Complex Packing used: 1-inch iodoform gauze Site anesthetized, incision made over site, wound drained and explored loculations, rinsed with copious amounts of normal saline, wound packed with sterile gauze, covered with dry, sterile dressing.  Pt tolerated procedure well without complications.  Instructions for care discussed verbally and pt provided with additional written instructions for homecare and f/u.   Labs Review Labs Reviewed  CBC WITH DIFFERENTIAL - Abnormal; Notable for the following:    RBC 3.99 (*)    Hemoglobin 12.6 (*)    HCT 35.4 (*)    Platelets 126 (*)    All other components within normal limits  COMPREHENSIVE METABOLIC PANEL - Abnormal; Notable for the following:    Glucose, Bld 105 (*)    BUN 26 (*)    Albumin 3.3 (*)    GFR calc non Af Amer 66 (*)    GFR calc  Af Amer 77 (*)    All other components within normal limits  POC OCCULT BLOOD, ED - Abnormal; Notable for the following:    Fecal Occult Bld POSITIVE (*)    All other components within normal limits  CULTURE, BLOOD (ROUTINE X 2)  CULTURE, BLOOD (ROUTINE X 2)  WOUND CULTURE  VITAMIN B12  FOLATE  IRON AND TIBC  FERRITIN  RETICULOCYTES  COMPREHENSIVE METABOLIC PANEL  CBC    Imaging Review Ct Pelvis W Contrast  12/14/2013   CLINICAL DATA:  Right buttock cellulitis and abscess.  EXAM: CT PELVIS WITH CONTRAST  TECHNIQUE: Multidetector CT imaging of the pelvis was performed using the standard protocol following the bolus administration of intravenous contrast.  CONTRAST:  OMNIPAQUE IOHEXOL 300 MG/ML  SOLN  COMPARISON:  None.  FINDINGS: Ill-defined increased density is seen in the subcutaneous tissues of the right buttock near the gluteal crease. A few central air bubbles are seen within this increased density within subcutaneous tissues, but no discrete or drainable fluid collections identified. This is likely to cellulitis and gas forming infection cannot be  excluded. No soft tissue gas seen elsewhere within the buttock or pelvic soft tissues. There is no evidence of osteomyelitis involving the sacrum or pelvis.  Mildly enlarged prostate causes mass effect on bladder base. A 16 mm calculus is seen in the urinary bladder. Urinary bladder is nondilated.  No pelvic soft tissue masses are identified. Mild lymphadenopathy is seen in the right external iliac chain, with lymph node measuring 16 mm on image 36. A 10 mm lymph node is also seen in the right inguinal region, which is not considered pathologically enlarged.  IMPRESSION: Poorly defined increased density with tiny central air bubbles within the subcutaneous tissues of the right buttock. This is consistent with cellulitis, and gas forming infection cannot be excluded.  No definite abscess or drainable fluid collection identified. No evidence of osteomyelitis.  Mildly enlarged prostate. Nondilated urinary bladder containing 16 mm calculus.  Mild right external iliac lymphadenopathy.   Electronically Signed   By: Myles Rosenthal M.D.   On: 12/14/2013 17:32     EKG Interpretation None      MDM   Final diagnoses:  GI bleed  Cellulitis of buttock, right   1 week history of abscess to right upper buttock. No fever, chills, nausea or vomiting. Patient with dementia at baseline and unable to give a history. He has been on bactrim for the past 6 days.  Abscess drained at bedside as above. Small amount of purulent obtained. Large abscess cavity packed. Will obtain CT scan to rule out deeper infection  Hemoglobin 12.6 from 16 in February. Stool brown but FOBT +.  CT scan shows no deeper infection and area of buttock abscess. Air bubbles seen likely represent change from incision and drainage.  Patient seems to have failed outpatient treatment. Will escalate antibiotic therapy to vancomycin and cipro. He also has anemia with blood in his stool. However, he has stable vitals without any report of black or bloody  stool. This will need to be watched closely.  I personally performed the services described in this documentation, which was scribed in my presence. The recorded information has been reviewed and is accurate.   Evan Octave, Lozano 12/15/13 [id number]

## 2013-12-14 NOTE — ED Notes (Signed)
22 gauge IV in right upper arm, infiltrated last of Cipro IV. IV d/c'd and bandage in place. Ice pack placed to right upper arm. Dr. Rito EhrlichKrishnan notified. Pharmacy called and asked if med is a vesicant; it is not.

## 2013-12-14 NOTE — Progress Notes (Signed)
ANTIBIOTIC CONSULT NOTE - INITIAL  Pharmacy Consult for Vancomycin & Zosyn Indication: cellulitis  No Known Allergies  Patient Measurements: Height: 5\' 6"  (167.6 cm) Weight: 126 lb 15.8 oz (57.6 kg) IBW/kg (Calculated) : 63.8  Vital Signs: Temp: 98.1 F (36.7 C) (06/17 2114) Temp src: Oral (06/17 2114) BP: 160/79 mmHg (06/17 2114) Pulse Rate: 83 (06/17 2114) Intake/Output from previous day:   Intake/Output from this shift:    Labs:  Recent Labs  12/14/13 1617  WBC 6.2  HGB 12.6*  PLT 126*  CREATININE 1.12   Estimated Creatinine Clearance: 52.1 ml/min (by C-G formula based on Cr of 1.12). No results found for this basename: VANCOTROUGH, VANCOPEAK, VANCORANDOM, GENTTROUGH, GENTPEAK, GENTRANDOM, TOBRATROUGH, TOBRAPEAK, TOBRARND, AMIKACINPEAK, AMIKACINTROU, AMIKACIN,  in the last 72 hours   Microbiology: No results found for this or any previous visit (from the past 720 hour(s)).  Medical History: Past Medical History  Diagnosis Date  . Alzheimer disease   . Hypertension   . Dementia   . BPH (benign prostatic hyperplasia)   . HLD (hyperlipidemia)     Medications:  Scheduled:  . ALPRAZolam  0.5 mg Oral BID  . [START ON 12/15/2013] aspirin  81 mg Oral Daily  . busPIRone  15 mg Oral BID  . [START ON 12/15/2013] carbidopa-levodopa  1 tablet Oral BID WC  . galantamine  4 mg Oral BID  . [START ON 12/15/2013] memantine  20 mg Oral Daily  . mirtazapine  15 mg Oral QHS  . pantoprazole  40 mg Oral Q1200  . piperacillin-tazobactam (ZOSYN)  IV  3.375 g Intravenous 3 times per day  . QUEtiapine  25 mg Oral QHS  . [START ON 12/15/2013] sertraline  50 mg Oral Daily  . [START ON 12/15/2013] vancomycin  1,000 mg Intravenous Q24H   Assessment: 68 yo M admitted from ALF with cellulitis of right buttock.  This has worsened on outpatient course of oral Bactrim.   CT was negative for osteomyelitis, but could not exclude gangrene.   Patient is afebrile with normal WBC.   Renal  function is at patient's baseline.   Vancomycin 6/17>> Zosyn 6/17>>  Goal of Therapy:  Vancomycin trough level 10-15 mcg/ml  Plan:  Zosyn 3.375gm IV Q8h to be infused over 4hrs Vancomycin 1gm IV q24h Check Vancomycin trough at steady state Monitor renal function and cx data   Elson ClanLilliston, Andrea Michelle 12/14/2013,9:31 PM

## 2013-12-14 NOTE — H&P (Signed)
[facility] History and Physical  Evan Lozano LSL:[id number] DOB: 14-Oct-1945 DOA: 12/14/2013   PCP: Wynn Banker, PA at his assisted living facility Specialists: None  Chief Complaint: Wound in the right buttock area  HPI: Evan Lozano is a 68 y.o. male with a past medical history of Alzheimer's Dementia, Parkinson's disease, who lives in an assisted living facility in the memory unit. He was diagnosed with cellulitis in the right buttock area last week and he was started on Bactrim on Friday. Over the course of the next few days this infection got worse. And, so, he was sent over here for further evaluation. Patient has advanced dementia and is unable to provide any history. His wife is at the bedside, but she doesn't know many details as well. As far as we know there has been no fever or chills. Patient has been somewhat lethargic over the past few days. So, once again history is extremely limited.  Home Medications: Prior to Admission medications   Medication Sig Start Date End Date Taking? Authorizing Provider  ALPRAZolam Duanne Moron) 0.5 MG tablet Take 0.5 mg by mouth 2 (two) times daily.    Yes Historical Provider, MD  aspirin 81 MG chewable tablet Chew 81 mg by mouth daily.   Yes Historical Provider, MD  B Complex-Biotin-FA (B-COMPLEX PO) Take 1 tablet by mouth daily.   Yes Historical Provider, MD  busPIRone (BUSPAR) 15 MG tablet Take 15 mg by mouth 2 (two) times daily.   Yes Historical Provider, MD  carbidopa-levodopa (SINEMET IR) 25-100 MG per tablet Take 1 tablet by mouth 2 (two) times daily.    Yes Historical Provider, MD  cetirizine (ZYRTEC) 10 MG tablet Take 10 mg by mouth daily as needed for allergies.   Yes Historical Provider, MD  galantamine (RAZADYNE) 4 MG tablet Take 4 mg by mouth 2 (two) times daily.   Yes Historical Provider, MD  Infant Care Products (DERMACLOUD) CREA Apply 1 application topically 3 (three) times daily as needed. rash   Yes Historical Provider, MD    memantine (NAMENDA) 10 MG tablet Take 20 mg by mouth daily.   Yes Historical Provider, MD  mirtazapine (REMERON) 15 MG tablet Take 15 mg by mouth at bedtime.   Yes Historical Provider, MD  Omega-3 Fatty Acids (FISH OIL) 1200 MG CAPS Take 1 capsule by mouth daily.   Yes Historical Provider, MD  QUEtiapine (SEROQUEL) 25 MG tablet Take 25 mg by mouth at bedtime.   Yes Historical Provider, MD  selenium sulfide (ANTI-DANDRUFF) 1 % LOTN Apply 1 application topically 3 (three) times a week.   Yes Historical Provider, MD  sertraline (ZOLOFT) 50 MG tablet Take 50 mg by mouth daily.   Yes Historical Provider, MD  vitamin C (ASCORBIC ACID) 500 MG tablet Take 500 mg by mouth daily.   Yes Historical Provider, MD  vitamin E 400 UNIT capsule Take 400 Units by mouth daily.   Yes Historical Provider, MD    Allergies: No Known Allergies  Past Medical History: Past Medical History  Diagnosis Date  . Alzheimer disease   . Hypertension   . Dementia   . BPH (benign prostatic hyperplasia)   . HLD (hyperlipidemia)     Past Surgical History  Procedure Laterality Date  . Rectal surgery      Social History: He lives in the memory care unit at a local assisted living facility. Previous history of smoking and alcohol use, but none currently. He is able to ambulate independently.  Family History:  Family History  Problem Relation Age of Onset  . Alzheimer's disease Father      Review of Systems - unable to do due to his advanced dementia  Physical Examination  Filed Vitals:   12/14/13 1530 12/14/13 1738  BP: 152/81 131/72  Pulse: 75 88  Temp: 97.7 F (36.5 C)   TempSrc: Oral   Resp: 18 18  Height: '5\' 6"'  (1.676 m)   Weight: 69.4 kg (153 lb)   SpO2: 99% 98%    BP 131/72  Pulse 88  Temp(Src) 97.7 F (36.5 C) (Oral)  Resp 18  Ht '5\' 6"'  (1.676 m)  Wt 69.4 kg (153 lb)  BMI 24.71 kg/m2  SpO2 98%  General appearance: alert, but difficult to communicate with. Does not follow commands all the  time. In no distress. Head: Normocephalic, without obvious abnormality, atraumatic Eyes: conjunctivae/corneas clear. PERRL, EOM's intact.  Neck: no adenopathy, no carotid bruit, no JVD, supple, symmetrical, trachea midline and thyroid not enlarged, symmetric, no tenderness/mass/nodules Back: symmetric, no curvature. ROM normal. No CVA tenderness. In the right buttock region there is an area of erythema, which is about 7 cm x 5 cm. There is packing noted. It is warm to touch. Indurated. Nontender. Resp: clear to auscultation bilaterally Cardio: regular rate and rhythm, S1, S2 normal, no murmur, click, rub or gallop GI: soft, non-tender; bowel sounds normal; no masses,  no organomegaly. External genitalia appears to be normal. Extremities: extremities normal, atraumatic, no cyanosis or edema Pulses: 2+ and symmetric Skin: Erythema over the right buttock areas described above. Neurologic: He is alert. Disoriented. No obvious focal neurological deficits. Resting tremor is noted, consistent with his history of Parkinson's.  Laboratory Data: Results for orders placed during the hospital encounter of 12/14/13 (from the past 48 hour(s))  CBC WITH DIFFERENTIAL     Status: Abnormal   Collection Time    12/14/13  4:17 PM      Result Value Ref Range   WBC 6.2  4.0 - 10.5 K/uL   RBC 3.99 (*) 4.22 - 5.81 MIL/uL   Hemoglobin 12.6 (*) 13.0 - 17.0 g/dL   HCT 35.4 (*) 39.0 - 52.0 %   MCV 88.7  78.0 - 100.0 fL   MCH 31.6  26.0 - 34.0 pg   MCHC 35.6  30.0 - 36.0 g/dL   RDW 13.7  11.5 - 15.5 %   Platelets 126 (*) 150 - 400 K/uL   Neutrophils Relative % 71  43 - 77 %   Neutro Abs 4.4  1.7 - 7.7 K/uL   Lymphocytes Relative 18  12 - 46 %   Lymphs Abs 1.1  0.7 - 4.0 K/uL   Monocytes Relative 8  3 - 12 %   Monocytes Absolute 0.5  0.1 - 1.0 K/uL   Eosinophils Relative 3  0 - 5 %   Eosinophils Absolute 0.2  0.0 - 0.7 K/uL   Basophils Relative 0  0 - 1 %   Basophils Absolute 0.0  0.0 - 0.1 K/uL    COMPREHENSIVE METABOLIC PANEL     Status: Abnormal   Collection Time    12/14/13  4:17 PM      Result Value Ref Range   Sodium 142  137 - 147 mEq/L   Potassium 4.4  3.7 - 5.3 mEq/L   Chloride 101  96 - 112 mEq/L   CO2 30  19 - 32 mEq/L   Glucose, Bld 105 (*) 70 - 99 mg/dL   BUN 26 (*)  6 - 23 mg/dL   Creatinine, Ser 1.12  0.50 - 1.35 mg/dL   Calcium 9.1  8.4 - 10.5 mg/dL   Total Protein 6.8  6.0 - 8.3 g/dL   Albumin 3.3 (*) 3.5 - 5.2 g/dL   AST 20  0 - 37 U/L   ALT <5  0 - 53 U/L   Alkaline Phosphatase 72  39 - 117 U/L   Total Bilirubin 0.4  0.3 - 1.2 mg/dL   GFR calc non Af Amer 66 (*) >90 mL/min   GFR calc Af Amer 77 (*) >90 mL/min   Comment: (NOTE)     The eGFR has been calculated using the CKD EPI equation.     This calculation has not been validated in all clinical situations.     eGFR's persistently <90 mL/min signify possible Chronic Kidney     Disease.  POC OCCULT BLOOD, ED     Status: Abnormal   Collection Time    12/14/13  5:31 PM      Result Value Ref Range   Fecal Occult Bld POSITIVE (*) NEGATIVE    Radiology Reports: Ct Pelvis W Contrast  12/14/2013   CLINICAL DATA:  Right buttock cellulitis and abscess.  EXAM: CT PELVIS WITH CONTRAST  TECHNIQUE: Multidetector CT imaging of the pelvis was performed using the standard protocol following the bolus administration of intravenous contrast.  CONTRAST:  126m OMNIPAQUE IOHEXOL 300 MG/ML  SOLN  COMPARISON:  None.  FINDINGS: Ill-defined increased density is seen in the subcutaneous tissues of the right buttock near the gluteal crease. A few central air bubbles are seen within this increased density within subcutaneous tissues, but no discrete or drainable fluid collections identified. This is likely to cellulitis and gas forming infection cannot be excluded. No soft tissue gas seen elsewhere within the buttock or pelvic soft tissues. There is no evidence of osteomyelitis involving the sacrum or pelvis.  Mildly enlarged prostate  causes mass effect on bladder base. A 16 mm calculus is seen in the urinary bladder. Urinary bladder is nondilated.  No pelvic soft tissue masses are identified. Mild lymphadenopathy is seen in the right external iliac chain, with lymph node measuring 16 mm on image 36. A 10 mm lymph node is also seen in the right inguinal region, which is not considered pathologically enlarged.  IMPRESSION: Poorly defined increased density with tiny central air bubbles within the subcutaneous tissues of the right buttock. This is consistent with cellulitis, and gas forming infection cannot be excluded.  No definite abscess or drainable fluid collection identified. No evidence of osteomyelitis.  Mildly enlarged prostate. Nondilated urinary bladder containing 16 mm calculus.  Mild right external iliac lymphadenopathy.   Electronically Signed   By: JEarle GellM.D.   On: 12/14/2013 17:32    Problem List  Principal Problem:   Cellulitis and abscess of buttock Active Problems:   Heme positive stool   Alzheimer's dementia   Parkinson disease   Assessment: This is a 68year old, Caucasian male, presents with cellulitis and abscess in the right buttock area. Incision and drainage was performed by the emergency department physician. Patient will need to be admitted for intravenous antibiotics. Patient was also found to have heme positive stools.  Plan: #1 cellulitis and abscess of the right buttock: He is status post I&D by the ED physician. We will initiate vancomycin and Zosyn for now. Blood and wound cultures will be sent off. Analgesic agents as needed. Wound care consultation will be obtained.  #2  heme positive, stools: Rectal exam was done by EDP because it was noted that his hemoglobin was 16 a few months ago, and is 12 today. There has been no reports of rectal bleeding or black stools. Rectal examination by ED physician revealed brown stool, which was heme positive. I have discussed this in detail with the  patient's wife and recommend that this be pursued as an outpatient as it doesn't appear to be an urgent issue at this time. There is no active bleeding. If his hemoglobin drops significantly while he is in the hospital inpatient evaluation may be necessary. In the meantime patient may benefit from a PPI, which will be initiated. But again there has been no history of rectal bleeding or black stools as far as the wife notes.  #3 history of Alzheimer's Dementia: Continue with his home medications.  #4 history of Parkinson's disease: Continue with his home medications including carbidopa, levodopa.  #5 normocytic anemia, and mild thrombocytopenia: Check anemia panel in the morning. Monitor counts.  PT and OT will be consulted.  DVT Prophylaxis: SCDs  Code Status: DO NOT RESUSCITATE based on my conversation with his wife  Family Communication: Discussed with his wife   Disposition Plan: Admit to MedSurg   Further management decisions will depend on results of further testing and patient's response to treatment.   [facility]  [facility] Pager [phone]  If 7PM-7AM, please contact night-coverage [email or web address] Password [facility]  12/14/2013, 7:39 PM  Disclaimer: This note was dictated with voice recognition software. Similar sounding words can inadvertently be transcribed and may not be corrected upon review.

## 2013-12-15 LAB — COMPREHENSIVE METABOLIC PANEL
ALT: 10 U/L (ref 0–53)
AST: 19 U/L (ref 0–37)
Albumin: 3.3 g/dL — ABNORMAL LOW (ref 3.5–5.2)
Alkaline Phosphatase: 62 U/L (ref 39–117)
BILIRUBIN TOTAL: 0.5 mg/dL (ref 0.3–1.2)
BUN: 19 mg/dL (ref 6–23)
CO2: 30 mEq/L (ref 19–32)
Calcium: 9.6 mg/dL (ref 8.4–10.5)
Chloride: 103 mEq/L (ref 96–112)
Creatinine, Ser: 1.16 mg/dL (ref 0.50–1.35)
GFR calc Af Amer: 73 mL/min — ABNORMAL LOW (ref >90)
GFR calc non Af Amer: 63 mL/min — ABNORMAL LOW (ref >90)
Glucose, Bld: 76 mg/dL (ref 70–99)
Potassium: 4.1 mEq/L (ref 3.7–5.3)
Sodium: 143 mEq/L (ref 137–147)
Total Protein: 6.9 g/dL (ref 6.0–8.3)

## 2013-12-15 LAB — CBC
HCT: 37.9 % — ABNORMAL LOW (ref 39.0–52.0)
Hemoglobin: 13.4 g/dL (ref 13.0–17.0)
MCH: 31.5 pg (ref 26.0–34.0)
MCHC: 35.4 g/dL (ref 30.0–36.0)
MCV: 89.2 fL (ref 78.0–100.0)
PLATELETS: 142 10*3/uL — AB (ref 150–400)
RBC: 4.25 MIL/uL (ref 4.22–5.81)
RDW: 13.7 % (ref 11.5–15.5)
WBC: 6.7 10*3/uL (ref 4.0–10.5)

## 2013-12-15 LAB — RETICULOCYTES
RBC.: 4.25 MIL/uL (ref 4.22–5.81)
RETIC COUNT ABSOLUTE: 68 10*3/uL (ref 19.0–186.0)
Retic Ct Pct: 1.6 % (ref 0.4–3.1)

## 2013-12-15 LAB — FERRITIN: Ferritin: 226 ng/mL (ref 22–322)

## 2013-12-15 LAB — IRON AND TIBC
IRON: 104 ug/dL (ref 42–135)
SATURATION RATIOS: 51 % (ref 20–55)
TIBC: 205 ug/dL — ABNORMAL LOW (ref 215–435)
UIBC: 101 ug/dL — AB (ref 125–400)

## 2013-12-15 LAB — VITAMIN B12: Vitamin B-12: 606 pg/mL (ref 211–911)

## 2013-12-15 LAB — FOLATE: Folate: 18.7 ng/mL

## 2013-12-15 NOTE — Consult Note (Signed)
WOC wound consult note Reason for Consult: Abscess to right gluteal area.  Wound type:Inflammatory/infectious Measurement: 2 cm x 3 cm x 1 cm  Wound bed: 80% pink, nongranulating tissue with 20% adherent slough at 9 o'clock Drainage (amount, consistency, odor) Moderate, serosanguinous drainage.  Periwound: Erythema, extending 4 cm circumferentially and indurated.  Dressing procedure/placement/frequency: Cleanse ulcer with NS and pat gently dry.  Pack open area lightly with Iodoform packing strip.  Top with 4x4 gauze and secure with ABD and medipore tape. Change daily.  Will not follow at this time.  Please re-consult if needed.  Maple HudsonKaren Sanders RN BSN CWON Pager [phone]

## 2013-12-15 NOTE — Clinical Social Work Psychosocial (Signed)
Clinical Social Work Department BRIEF PSYCHOSOCIAL ASSESSMENT 12/15/2013  Patient:  Evan Lozano, Evan Lozano     Account Number:  [id number]     Admit date:  12/14/2013  Clinical Social Worker:  Wyatt Haste  Date/Time:  12/15/2013 10:35 AM  Referred by:  CSW  Date Referred:  12/15/2013 Referred for  ALF Placement   Other Referral:   Interview type:  Family Other interview type:   wife- Evan Lozano    PSYCHOSOCIAL DATA Living Status:  FACILITY Admitted from facility:  [facility] Level of care:  Assisted Living Primary support name:  Evan Lozano Primary support relationship to patient:  SPOUSE Degree of support available:   supportive    CURRENT CONCERNS Current Concerns  Post-Acute Placement   Other Concerns:    SOCIAL WORK ASSESSMENT / PLAN CSW met with pt's wife at bedside. Pt alert, but oriented to self only. Pt has Alzheimer's. Family appears to be involved and supportive. Wife reports she either sees or calls to check on pt daily. He has been a resident at [facility] for about 3 years, with the last year being on the memory care unit. Pt's wife reports pt came to ED due to an abscess. He was started on an antibiotic last week by PA who comes to [facility]. At baseline, pt ambulates independently, but requires extensive assist with all other ADLs. No home health prior to admission. Pt's wife reports she has been pleased with care at facility. She has found staff to be responsive to her requests which has made her feel more comfortable about pt being in facility. CSW spoke with Crystal at [facility] regarding pt. Initially concerned about daily dressing changes as facility cannot handle this. Wound care RN evaluated pt today and called Crystal informing her that 3x weekly is sufficient upon return. Crystal agreeable to return and CM aware to set up home health for wound care. Pt's wife requests pt to return to [facility] at d/c.   Assessment/plan status:  Psychosocial  Support/Ongoing Assessment of Needs Other assessment/ plan:   Information/referral to community resources:   [place] for home health    PATIENT'S/FAMILY'S RESPONSE TO PLAN OF CARE: Pt unable to discuss plan of care due to dementia. Pt's wife reports positive feelings regarding return to [facility] when medically stable. Facility can manage wound care with home health.       Benay Pike, [place]

## 2013-12-15 NOTE — Care Management Note (Unsigned)
    Page 1 of 1   12/16/2013     11:22:57 AM CARE MANAGEMENT NOTE 12/16/2013  Patient:  Evan Lozano,Evan Lozano   Account Number:  [id number]  Date Initiated:  12/15/2013  Documentation initiated by:  Anibal HendersonBOLDEN,GENEVA  Subjective/Objective Assessment:   Admitted with abscess and cellulitis of buttock. pt is from [facility] and will return there at D/C. CSW aware     Action/Plan:   Will need HH at D/C for wound care. Facility uses [facility], and spouse agrees with this agency   Anticipated DC Date:  12/16/2013   Anticipated DC Plan:  HOME W HOME HEALTH SERVICES  In-house referral  Clinical Social Worker      DC Planning Services  CM consult      Choice offered to / List presented to:  C-3 Spouse        HH arranged  HH-1 Charity fundraiserN      [facility]   Status of service:  In process, will continue to follow Medicare Important Message given?  YES (If response is "NO", the following Medicare IM given date fields will be blank) Date Medicare IM given:  12/16/2013 Date Additional Medicare IM given:    Discharge Disposition:  HOME W HOME HEALTH SERVICES  Per UR Regulation:  Reviewed for med. necessity/level of care/duration of stay  If discussed at Long Length of Stay Meetings, dates discussed:    Comments:  12/15/13 1600 [place] bolden RN/CM

## 2013-12-15 NOTE — Care Management Utilization Note (Signed)
UR completed 

## 2013-12-15 NOTE — Progress Notes (Signed)
[facility] PROGRESS NOTE  Evan Lozano ZOX:[id number] DOB: 12/30/1945 DOA: 12/14/2013 PCP: No PCP Per Patient  Assessment/Plan: 1. Cellulitis and abscess of right buttock. Appreciate wound care input. He is to continue on iodoform packing and dry gauze dressing. He will need to continue on intravenous antibiotics for now. Will transition to by mouth once his infection has improved. 2. Heme positive stools. No evidence of gross bleeding. Hemoglobin is stable. This can be pursued in the outpatient setting. 3. History of Alzheimer's disease. Continue home medications 4. History of Parkinson's disease. Continue home medications  Code Status: DNR Family Communication: Discussed with wife at the bedside Disposition Plan: [facility] [facility] once improved   Consultants:  Wound care  Procedures:  Incision and drainage of right gluteal abscess done in the emergency room  Antibiotics:  Vancomycin 6/17  Zosyn 6/17  HPI/Subjective: Patient is essentially nonverbal. Does not appear to be in any distress.  Objective: Filed Vitals:   12/15/13 1504  BP: 141/67  Pulse: 85  Temp: 98 F (36.7 C)  Resp: 18    Intake/Output Summary (Last 24 hours) at 12/15/13 1840 Last data filed at 12/15/13 1817  Gross per 24 hour  Intake    895 ml  Output   1000 ml  Net   -105 ml   Filed Weights   12/14/13 1530 12/14/13 2114  Weight: 69.4 kg (153 lb) 57.6 kg (126 lb 15.8 oz)    Exam:   General:  NAD  Cardiovascular: S1, S2, regular rate and rhythm  Respiratory: Clear to auscultation bilaterally  Abdomen: Soft, nontender, positive bowel sounds  Musculoskeletal: No pedal edema bilaterally  Skin: Right gluteal area has large areas of erythema with induration. This wound has been incised.   Data Reviewed: Basic Metabolic Panel:  Recent Labs Lab 12/14/13 1617 12/15/13 0524  NA 142 143  K 4.4 4.1  CL 101 103  CO2 30 30  GLUCOSE 105* 76  BUN 26* 19   CREATININE 1.12 1.16  CALCIUM 9.1 9.6   Liver Function Tests:  Recent Labs Lab 12/14/13 1617 12/15/13 0524  AST 20 19  ALT <5 10  ALKPHOS 72 62  BILITOT 0.4 0.5  PROT 6.8 6.9  ALBUMIN 3.3* 3.3*   No results found for this basename: LIPASE, AMYLASE,  in the last 168 hours No results found for this basename: AMMONIA,  in the last 168 hours CBC:  Recent Labs Lab 12/14/13 1617 12/15/13 0524  WBC 6.2 6.7  NEUTROABS 4.4  --   HGB 12.6* 13.4  HCT 35.4* 37.9*  MCV 88.7 89.2  PLT 126* 142*   Cardiac Enzymes: No results found for this basename: CKTOTAL, CKMB, CKMBINDEX, TROPONINI,  in the last 168 hours BNP (last 3 results) No results found for this basename: PROBNP,  in the last 8760 hours CBG: No results found for this basename: GLUCAP,  in the last 168 hours  Recent Results (from the past 240 hour(s))  CULTURE, BLOOD (ROUTINE X 2)     Status: None   Collection Time    12/14/13  4:17 PM      Result Value Ref Range Status   Specimen Description BLOOD LEFT FOREARM   Final   Special Requests BOTTLES DRAWN AEROBIC AND ANAEROBIC 8CC   Final   Culture NO GROWTH 1 DAY   Final   Report Status PENDING   Incomplete  CULTURE, BLOOD (ROUTINE X 2)     Status: None   Collection Time  12/14/13  4:17 PM      Result Value Ref Range Status   Specimen Description BLOOD LEFT HAND   Final   Special Requests BOTTLES DRAWN AEROBIC AND ANAEROBIC 4CC   Final   Culture NO GROWTH 1 DAY   Final   Report Status PENDING   Incomplete     Studies: Ct Pelvis W Contrast  12/14/2013   CLINICAL DATA:  Right buttock cellulitis and abscess.  EXAM: CT PELVIS WITH CONTRAST  TECHNIQUE: Multidetector CT imaging of the pelvis was performed using the standard protocol following the bolus administration of intravenous contrast.  CONTRAST:  100mL OMNIPAQUE IOHEXOL 300 MG/ML  SOLN  COMPARISON:  None.  FINDINGS: Ill-defined increased density is seen in the subcutaneous tissues of the right buttock near the  gluteal crease. A few central air bubbles are seen within this increased density within subcutaneous tissues, but no discrete or drainable fluid collections identified. This is likely to cellulitis and gas forming infection cannot be excluded. No soft tissue gas seen elsewhere within the buttock or pelvic soft tissues. There is no evidence of osteomyelitis involving the sacrum or pelvis.  Mildly enlarged prostate causes mass effect on bladder base. A 16 mm calculus is seen in the urinary bladder. Urinary bladder is nondilated.  No pelvic soft tissue masses are identified. Mild lymphadenopathy is seen in the right external iliac chain, with lymph node measuring 16 mm on image 36. A 10 mm lymph node is also seen in the right inguinal region, which is not considered pathologically enlarged.  IMPRESSION: Poorly defined increased density with tiny central air bubbles within the subcutaneous tissues of the right buttock. This is consistent with cellulitis, and gas forming infection cannot be excluded.  No definite abscess or drainable fluid collection identified. No evidence of osteomyelitis.  Mildly enlarged prostate. Nondilated urinary bladder containing 16 mm calculus.  Mild right external iliac lymphadenopathy.   Electronically Signed   By: Myles RosenthalJohn  Stahl M.D.   On: 12/14/2013 17:32    Scheduled Meds: . ALPRAZolam  0.5 mg Oral BID  . aspirin  81 mg Oral Daily  . busPIRone  15 mg Oral BID  . carbidopa-levodopa  1 tablet Oral BID WC  . galantamine  4 mg Oral BID  . memantine  20 mg Oral Daily  . mirtazapine  15 mg Oral QHS  . pantoprazole  40 mg Oral Q1200  . piperacillin-tazobactam (ZOSYN)  IV  3.375 g Intravenous 3 times per day  . QUEtiapine  25 mg Oral QHS  . sertraline  50 mg Oral Daily  . vancomycin  1,000 mg Intravenous Q24H   Continuous Infusions:   Principal Problem:   Cellulitis and abscess of buttock Active Problems:   Heme positive stool   Alzheimer's dementia   Parkinson  disease    Time spent: 30mins    [facility] Hope Recovery CenterMEMON,Evan  [facility] Pager [phone]. If 7PM-7AM, please contact night-coverage at [email or web address], password [email or web address] 12/15/2013, 6:40 PM  LOS: 1 day

## 2013-12-16 LAB — BASIC METABOLIC PANEL
BUN: 19 mg/dL (ref 6–23)
CALCIUM: 9.1 mg/dL (ref 8.4–10.5)
CO2: 31 meq/L (ref 19–32)
Chloride: 104 mEq/L (ref 96–112)
Creatinine, Ser: 1.3 mg/dL (ref 0.50–1.35)
GFR, EST AFRICAN AMERICAN: 64 mL/min — AB (ref >90)
GFR, EST NON AFRICAN AMERICAN: 55 mL/min — AB (ref >90)
Glucose, Bld: 81 mg/dL (ref 70–99)
Potassium: 4.1 mEq/L (ref 3.7–5.3)
SODIUM: 143 meq/L (ref 137–147)

## 2013-12-16 NOTE — Progress Notes (Signed)
[facility] PROGRESS NOTE  Evan BogaWilliam Lozano ZOX:[id number] DOB: 02/19/1946 DOA: 12/14/2013 PCP: No PCP Per Patient  Assessment/Plan: 1. Cellulitis and abscess of right buttock. Appreciate wound care input. He is to continue on iodoform packing and dry gauze dressing. His cellulitis appears to be improving. Wound culture indicates few Staphylococcus aureus. He will need to continue on intravenous antibiotics for now. Can possibly transition to oral antibiotics tomorrow. 2. Heme positive stools. No evidence of gross bleeding. Hemoglobin is stable. This can be pursued in the outpatient setting. 3. History of Alzheimer's disease. Continue home medications 4. History of Parkinson's disease. Continue home medications  Code Status: DNR Family Communication: Discussed with wife at the bedside Disposition Plan: [place] [facility] [place]   Consultants:  Wound care  Procedures:  Incision and drainage of right gluteal abscess done in the emergency room  Antibiotics:  Vancomycin 6/17  Zosyn 6/17  HPI/Subjective: Patient is essentially nonverbal. Does not appear to be in any distress.  Objective: Filed Vitals:   12/16/13 1353  BP: 121/73  Pulse: 92  Temp: 97.4 F (36.3 C)  Resp: 18    Intake/Output Summary (Last 24 hours) at 12/16/13 1847 Last data filed at 12/16/13 1731  Gross per 24 hour  Intake    970 ml  Output   1425 ml  Net   -455 ml   Filed Weights   12/14/13 1530 12/14/13 2114  Weight: 69.4 kg (153 lb) 57.6 kg (126 lb 15.8 oz)    Exam:   General:  NAD  Cardiovascular: S1, S2, regular rate and rhythm  Respiratory: Clear to auscultation bilaterally  Abdomen: Soft, nontender, positive bowel sounds  Musculoskeletal: No pedal edema bilaterally  Skin: Right gluteal area has large areas of erythema with induration. This wound has been incised and contains packing. Erythema has been marked and does not appear to be extending past its  margins  Data Reviewed: Basic Metabolic Panel:  Recent Labs Lab 12/14/13 1617 12/15/13 0524 12/16/13 0534  NA 142 143 143  K 4.4 4.1 4.1  CL 101 103 104  CO2 30 30 31   GLUCOSE 105* 76 81  BUN 26* 19 19  CREATININE 1.12 1.16 1.30  CALCIUM 9.1 9.6 9.1   Liver Function Tests:  Recent Labs Lab 12/14/13 1617 12/15/13 0524  AST 20 19  ALT <5 10  ALKPHOS 72 62  BILITOT 0.4 0.5  PROT 6.8 6.9  ALBUMIN 3.3* 3.3*   No results found for this basename: LIPASE, AMYLASE,  in the last 168 hours No results found for this basename: AMMONIA,  in the last 168 hours CBC:  Recent Labs Lab 12/14/13 1617 12/15/13 0524  WBC 6.2 6.7  NEUTROABS 4.4  --   HGB 12.6* 13.4  HCT 35.4* 37.9*  MCV 88.7 89.2  PLT 126* 142*   Cardiac Enzymes: No results found for this basename: CKTOTAL, CKMB, CKMBINDEX, TROPONINI,  in the last 168 hours BNP (last 3 results) No results found for this basename: PROBNP,  in the last 8760 hours CBG: No results found for this basename: GLUCAP,  in the last 168 hours  Recent Results (from the past 240 hour(s))  CULTURE, BLOOD (ROUTINE X 2)     Status: None   Collection Time    12/14/13  4:17 PM      Result Value Ref Range Status   Specimen Description BLOOD LEFT FOREARM   Final   Special Requests BOTTLES DRAWN AEROBIC AND ANAEROBIC 8CC   Final  Culture NO GROWTH 1 DAY   Final   Report Status PENDING   Incomplete  CULTURE, BLOOD (ROUTINE X 2)     Status: None   Collection Time    12/14/13  4:17 PM      Result Value Ref Range Status   Specimen Description BLOOD LEFT HAND   Final   Special Requests BOTTLES DRAWN AEROBIC AND ANAEROBIC 4CC   Final   Culture NO GROWTH 1 DAY   Final   Report Status PENDING   Incomplete  WOUND CULTURE     Status: None   Collection Time    12/15/13  5:45 AM      Result Value Ref Range Status   Specimen Description WOUND RIGHT BUTTOCK   Final   Special Requests NONE   Final   Gram Stain     Final   Value: NO WBC SEEN      NO SQUAMOUS EPITHELIAL CELLS SEEN     NO ORGANISMS SEEN     [facility]   Culture     Final   Value: FEW STAPHYLOCOCCUS AUREUS     Note: RIFAMPIN AND GENTAMICIN SHOULD NOT BE USED AS SINGLE DRUGS FOR TREATMENT OF STAPH INFECTIONS.     Performed at [facility]   Report Status PENDING   Incomplete     Studies: No results found.  Scheduled Meds: . ALPRAZolam  0.5 mg Oral BID  . aspirin  81 mg Oral Daily  . busPIRone  15 mg Oral BID  . carbidopa-levodopa  1 tablet Oral BID WC  . galantamine  4 mg Oral BID  . memantine  20 mg Oral Daily  . mirtazapine  15 mg Oral QHS  . pantoprazole  40 mg Oral Q1200  . piperacillin-tazobactam (ZOSYN)  IV  3.375 g Intravenous 3 times per day  . QUEtiapine  25 mg Oral QHS  . sertraline  50 mg Oral Daily  . vancomycin  1,000 mg Intravenous Q24H   Continuous Infusions:   Principal Problem:   Cellulitis and abscess of buttock Active Problems:   Heme positive stool   Alzheimer's dementia   Parkinson disease    Time spent: 25mins    [facility]omMEMON,JEHANZEB  [facility] Pager [phone]. If 7PM-7AM, please contact night-coverage at [email or web address], password [email or web address] 12/16/2013, 6:47 PM  LOS: 2 days

## 2013-12-16 NOTE — Progress Notes (Signed)
ANTIBIOTIC CONSULT NOTE - follow up  Pharmacy Consult for Vancomycin & Zosyn Indication: cellulitis  No Known Allergies  Patient Measurements: Height: 5\' 6"  (167.6 cm) Weight: 126 lb 15.8 oz (57.6 kg) IBW/kg (Calculated) : 63.8  Vital Signs: Temp: 97.3 F (36.3 C) (06/19 0517) Temp src: Oral (06/19 0517) BP: 124/79 mmHg (06/19 0517) Pulse Rate: 64 (06/19 0517) Intake/Output from previous day: 06/18 0701 - 06/19 0700 In: 360 [P.O.:360] Out: 1800 [Urine:1800] Intake/Output from this shift: Total I/O In: 360 [P.O.:360] Out: -   Labs:  Recent Labs  12/14/13 1617 12/15/13 0524 12/16/13 0534  WBC 6.2 6.7  --   HGB 12.6* 13.4  --   PLT 126* 142*  --   CREATININE 1.12 1.16 1.30   Estimated Creatinine Clearance: 44.9 ml/min (by C-G formula based on Cr of 1.3). No results found for this basename: VANCOTROUGH, Leodis BinetVANCOPEAK, VANCORANDOM, GENTTROUGH, GENTPEAK, GENTRANDOM, TOBRATROUGH, TOBRAPEAK, TOBRARND, AMIKACINPEAK, AMIKACINTROU, AMIKACIN,  in the last 72 hours   Microbiology: Recent Results (from the past 720 hour(s))  CULTURE, BLOOD (ROUTINE X 2)     Status: None   Collection Time    12/14/13  4:17 PM      Result Value Ref Range Status   Specimen Description BLOOD LEFT FOREARM   Final   Special Requests BOTTLES DRAWN AEROBIC AND ANAEROBIC 8CC   Final   Culture NO GROWTH 1 DAY   Final   Report Status PENDING   Incomplete  CULTURE, BLOOD (ROUTINE X 2)     Status: None   Collection Time    12/14/13  4:17 PM      Result Value Ref Range Status   Specimen Description BLOOD LEFT HAND   Final   Special Requests BOTTLES DRAWN AEROBIC AND ANAEROBIC 4CC   Final   Culture NO GROWTH 1 DAY   Final   Report Status PENDING   Incomplete  WOUND CULTURE     Status: None   Collection Time    12/15/13  5:45 AM      Result Value Ref Range Status   Specimen Description WOUND RIGHT BUTTOCK   Final   Special Requests NONE   Final   Gram Stain PENDING   Incomplete   Culture     Final   Value: FEW STAPHYLOCOCCUS AUREUS     Note: RIFAMPIN AND GENTAMICIN SHOULD NOT BE USED AS SINGLE DRUGS FOR TREATMENT OF STAPH INFECTIONS.     Performed at [facility]   Report Status PENDING   Incomplete   Medical History: Past Medical History  Diagnosis Date  . Alzheimer disease   . Hypertension   . Dementia   . BPH (benign prostatic hyperplasia)   . HLD (hyperlipidemia)    Medications:  Scheduled:  . ALPRAZolam  0.5 mg Oral BID  . aspirin  81 mg Oral Daily  . busPIRone  15 mg Oral BID  . carbidopa-levodopa  1 tablet Oral BID WC  . galantamine  4 mg Oral BID  . memantine  20 mg Oral Daily  . mirtazapine  15 mg Oral QHS  . pantoprazole  40 mg Oral Q1200  . piperacillin-tazobactam (ZOSYN)  IV  3.375 g Intravenous 3 times per day  . QUEtiapine  25 mg Oral QHS  . sertraline  50 mg Oral Daily  . vancomycin  1,000 mg Intravenous Q24H   Assessment: 68 yo M admitted from ALF with cellulitis of right buttock.  This has worsened on outpatient course of oral Bactrim.  CT was negative for osteomyelitis, but could not exclude gangrene.   Patient is afebrile with normal WBC.   SCr has bumped up slightly since admission.    Vancomycin 6/17>> Zosyn 6/17>>  Goal of Therapy:  Vancomycin trough level 10-15 mcg/ml  Plan:  Zosyn 3.375gm IV Q8h to be infused over 4hrs Vancomycin 1gm IV q24h Check Vancomycin trough at steady state Monitor renal function and cx data   Valrie HartHall, Scott A 12/16/2013,9:13 AM

## 2013-12-17 LAB — BASIC METABOLIC PANEL
BUN: 18 mg/dL (ref 6–23)
CO2: 30 mEq/L (ref 19–32)
Calcium: 9.2 mg/dL (ref 8.4–10.5)
Chloride: 102 mEq/L (ref 96–112)
Creatinine, Ser: 1.21 mg/dL (ref 0.50–1.35)
GFR, EST AFRICAN AMERICAN: 70 mL/min — AB (ref >90)
GFR, EST NON AFRICAN AMERICAN: 60 mL/min — AB (ref >90)
Glucose, Bld: 80 mg/dL (ref 70–99)
POTASSIUM: 3.9 meq/L (ref 3.7–5.3)
SODIUM: 142 meq/L (ref 137–147)

## 2013-12-17 LAB — WOUND CULTURE: Gram Stain: NONE SEEN

## 2013-12-17 MED ORDER — DOXYCYCLINE HYCLATE 100 MG PO TABS: 100.0000 mg | ORAL_TABLET | Freq: Two times a day (BID) | ORAL | Status: DC

## 2013-12-17 MED ORDER — PANTOPRAZOLE SODIUM 40 MG PO TBEC: 40.0000 mg | DELAYED_RELEASE_TABLET | Freq: Every day | ORAL | Status: AC

## 2013-12-17 MED ORDER — DOXYCYCLINE HYCLATE 100 MG PO TABS
100.0000 mg | ORAL_TABLET | Freq: Two times a day (BID) | ORAL | Status: DC
  Administered 2013-12-17 – 2013-12-18 (×2): 100 mg via ORAL
  Filled 2013-12-17 (×3): qty 1

## 2013-12-17 NOTE — Discharge Instructions (Signed)
Abscess Care After An abscess (also called a boil or furuncle) is an infected area that contains a collection of pus. Signs and symptoms of an abscess include pain, tenderness, redness, or hardness, or you may feel a moveable soft area under your skin. An abscess can occur anywhere in the body. The infection may spread to surrounding tissues causing cellulitis. A cut (incision) by the surgeon was made over your abscess and the pus was drained out. Gauze may have been packed into the space to provide a drain that will allow the cavity to heal from the inside outwards. The boil may be painful for 5 to 7 days. Most people with a boil do not have high fevers. Your abscess, if seen early, may not have localized, and may not have been lanced. If not, another appointment may be required for this if it does not get better on its own or with medications. HOME CARE INSTRUCTIONS   Only take over-the-counter or prescription medicines for pain, discomfort, or fever as directed by your caregiver.  When you bathe, soak and then remove gauze or iodoform packs at least daily or as directed by your caregiver. You may then wash the wound gently with mild soapy water. Repack with gauze or do as your caregiver directs. SEEK IMMEDIATE MEDICAL CARE IF:   You develop increased pain, swelling, redness, drainage, or bleeding in the wound site.  You develop signs of generalized infection including muscle aches, chills, fever, or a general ill feeling.  An oral temperature above 102 F (38.9 C) develops, not controlled by medication. See your caregiver for a recheck if you develop any of the symptoms described above. If medications (antibiotics) were prescribed, take them as directed. Document Released: 01/02/2005 Document Revised: 09/08/2011 Document Reviewed: 08/30/2007 [facility] Patient Information 2015 [place], [place]. This information is not intended to replace advice given to you by your health care provider. Make sure  you discuss any questions you have with your health care provider.  Cellulitis Cellulitis is an infection of the skin and the tissue under the skin. The infected area is usually red and tender. This happens most often in the arms and lower legs. HOME CARE   Take your antibiotic medicine as told. Finish the medicine even if you start to feel better.  Keep the infected arm or leg raised (elevated).  Put a warm cloth on the area up to 4 times per day.  Only take medicines as told by your doctor.  Keep all doctor visits as told. GET HELP RIGHT AWAY IF:   You have a fever.  You feel very sleepy.  You throw up (vomit) or have watery poop (diarrhea).  You feel sick and have muscle aches and pains.  You see red streaks on the skin coming from the infected area.  Your red area gets bigger or turns a dark color.  Your bone or joint under the infected area is painful after the skin heals.  Your infection comes back in the same area or different area.  You have a puffy (swollen) bump in the infected area.  You have new symptoms. MAKE SURE YOU:   Understand these instructions.  Will watch your condition.  Will get help right away if you are not doing well or get worse. Document Released: 12/03/2007 Document Revised: 12/16/2011 Document Reviewed: 09/01/2011 [facility] Patient Information 2015 [place], [place]. This information is not intended to replace advice given to you by your health care provider. Make sure you discuss any questions you  have with your health care provider. ° °

## 2013-12-17 NOTE — Progress Notes (Signed)
12/17/13 1043 Lab result called from [facility], wound culture collected 12/15/13 growing MRSA. Text-paged Dr. Kerry HoughMemon to notify. Earnstine RegalAshley Rogers, RN

## 2013-12-17 NOTE — Progress Notes (Addendum)
12/17/13 1741 Received update from Lovette Clicheonna Crowder, CSW [facility]. Stated facility staff told her they do not accept weekend discharges. She is awaiting return call from administrator regarding issue. Spoke with [facility] staff member, Myrene BuddyYvonne this afternoon, who stated they would accept patient discharge back on weekends. Dr. Kerry HoughMemon aware. Discharge on hold, possible discharge tomorrow if [facility] system available to update. Patient's wife at bedside this afternoon, updated and aware of possible discharge tomorrow. Stated understood. Earnstine RegalAshley Rogers, RN

## 2013-12-17 NOTE — Discharge Summary (Addendum)
Physician Discharge Summary  Evan BogaWilliam Klem NWG:[id number] DOB: 03/22/1946 DOA: 12/14/2013  PCP: No PCP Per Patient  Admit date: 12/14/2013 Discharge date: 12/17/2013  Time spent: 40minutes  Recommendations for Outpatient Follow-up:  1. Patient will be discharged back to [facility] with home health RN for dressing changes 2. Follow up with pcp in 2 weeks 3. Consider outpatient work up for heme positive stools  Discharge Diagnoses:  Principal Problem:   Cellulitis and abscess of buttock Active Problems:   Heme positive stool   Alzheimer's dementia   Parkinson disease   Discharge Condition: improved  Diet recommendation: low salt  Filed Weights   12/14/13 1530 12/14/13 2114  Weight: 69.4 kg (153 lb) 57.6 kg (126 lb 15.8 oz)    History of present illness:  Evan Lozano is a 68 y.o. male with a past medical history of Alzheimer's Dementia, Parkinson's disease, who lives in an assisted living facility in the memory unit. He was diagnosed with cellulitis in the right buttock area last week and he was started on Bactrim on Friday. Over the course of the next few days this infection got worse. And, so, he was sent over here for further evaluation. Patient has advanced dementia and is unable to provide any history. His wife is at the bedside, but she doesn't know many details as well. As far as we know there has been no fever or chills. Patient has been somewhat lethargic over the past few days. So, once again history is extremely limited.   Hospital Course:  Patient was admitted to the hospital with cellulitis and right gluteal abscess.  This was incised and drained in the ED.  He was started on empiric antibiotics.  Wound culture was positive for MRSA. He will be transitioned to doxycycline.  He has been afebrile and has a normal WBC count.  He was seen by wound care who recommended daily dressing changes with iodoform packing.  He will follow up with his primary care doctor in 1-2  weeks  Patient also found to have heme positive stools without any gross bleeding. Hemoglobin has been normal.  This can be further worked up as an outpatient.  Procedures:  Incision and drainage of gluteal abscess in ED  Consultations:  Wound care  Discharge Exam: Filed Vitals:   12/17/13 0620  BP: 124/82  Pulse: 62  Temp: 97.3 F (36.3 C)  Resp: 18    General: NAD Cardiovascular: S1, S2 RRR Respiratory: CTA B  Discharge Instructions You were cared for by a hospitalist during your hospital stay. If you have any questions about your discharge medications or the care you received while you were in the hospital after you are discharged, you can call the unit and asked to speak with the hospitalist on call if the hospitalist that took care of you is not available. Once you are discharged, your primary care physician will handle any further medical issues. Please note that NO REFILLS for any discharge medications will be authorized once you are discharged, as it is imperative that you return to your primary care physician (or establish a relationship with a primary care physician if you do not have one) for your aftercare needs so that they can reassess your need for medications and monitor your lab values.  Discharge Instructions   Call MD for:  redness, tenderness, or signs of infection (pain, swelling, redness, odor or green/yellow discharge around incision site)    Complete by:  As directed      Call  MD for:  severe uncontrolled pain    Complete by:  As directed      Call MD for:  temperature >100.4    Complete by:  As directed      Diet - low sodium heart healthy    Complete by:  As directed      Face-to-face encounter (required for Medicare/Medicaid patients)    Complete by:  As directed   I MEMON,JEHANZEB certify that this patient is under my care and that I, or a nurse practitioner or physician's assistant working with me, had a face-to-face encounter that meets the  physician face-to-face encounter requirements with this patient on 12/17/2013. The encounter with the patient was in whole, or in part for the following medical condition(s) which is the primary reason for home health care (List medical condition): abscess, gluteal that was incised, needs home health RN for dressing changes  The encounter with the patient was in whole, or in part, for the following medical condition, which is the primary reason for home health care:  cellulitis with abscess  I certify that, based on my findings, the following services are medically necessary home health services:  Nursing  My clinical findings support the need for the above services:  Cognitive impairments, dementia, or mental confusion  that make it unsafe to leave home  Further, I certify that my clinical findings support that this patient is homebound due to:  Mental confusion  Reason for Medically Necessary Home Health Services:  Skilled Nursing- Post-Surgical Wound Assessment and Care     Home Health    Complete by:  As directed   To provide the following care/treatments:  RN     Increase activity slowly    Complete by:  As directed             Medication List         ALPRAZolam 0.5 MG tablet  Commonly known as:  XANAX  Take 0.5 mg by mouth 2 (two) times daily.     ANTI-DANDRUFF 1 % Lotn  Generic drug:  selenium sulfide  Apply 1 application topically 3 (three) times a week.     aspirin 81 MG chewable tablet  Chew 81 mg by mouth daily.     B-COMPLEX PO  Take 1 tablet by mouth daily.     busPIRone 15 MG tablet  Commonly known as:  BUSPAR  Take 15 mg by mouth 2 (two) times daily.     carbidopa-levodopa 25-100 MG per tablet  Commonly known as:  SINEMET IR  Take 1 tablet by mouth 2 (two) times daily.     cetirizine 10 MG tablet  Commonly known as:  ZYRTEC  Take 10 mg by mouth daily as needed for allergies.     DERMACLOUD Crea  Apply 1 application topically 3 (three) times daily as needed.  rash     doxycycline 100 MG tablet  Commonly known as:  VIBRA-TABS  Take 1 tablet (100 mg total) by mouth 2 (two) times daily.     Fish Oil 1200 MG Caps  Take 1 capsule by mouth daily.     galantamine 4 MG tablet  Commonly known as:  RAZADYNE  Take 4 mg by mouth 2 (two) times daily.     memantine 10 MG tablet  Commonly known as:  NAMENDA  Take 20 mg by mouth daily.     mirtazapine 15 MG tablet  Commonly known as:  REMERON  Take 15 mg by mouth at bedtime.  pantoprazole 40 MG tablet  Commonly known as:  PROTONIX  Take 1 tablet (40 mg total) by mouth daily at 12 noon.     QUEtiapine 25 MG tablet  Commonly known as:  SEROQUEL  Take 25 mg by mouth at bedtime.     sertraline 50 MG tablet  Commonly known as:  ZOLOFT  Take 50 mg by mouth daily.     vitamin C 500 MG tablet  Commonly known as:  ASCORBIC ACID  Take 500 mg by mouth daily.     vitamin E 400 UNIT capsule  Take 400 Units by mouth daily.       No Known Allergies    The results of significant diagnostics from this hospitalization (including imaging, microbiology, ancillary and laboratory) are listed below for reference.    Significant Diagnostic Studies: Ct Pelvis W Contrast  12/14/2013   CLINICAL DATA:  Right buttock cellulitis and abscess.  EXAM: CT PELVIS WITH CONTRAST  TECHNIQUE: Multidetector CT imaging of the pelvis was performed using the standard protocol following the bolus administration of intravenous contrast.  CONTRAST:  100mL OMNIPAQUE IOHEXOL 300 MG/ML  SOLN  COMPARISON:  None.  FINDINGS: Ill-defined increased density is seen in the subcutaneous tissues of the right buttock near the gluteal crease. A few central air bubbles are seen within this increased density within subcutaneous tissues, but no discrete or drainable fluid collections identified. This is likely to cellulitis and gas forming infection cannot be excluded. No soft tissue gas seen elsewhere within the buttock or pelvic soft tissues.  There is no evidence of osteomyelitis involving the sacrum or pelvis.  Mildly enlarged prostate causes mass effect on bladder base. A 16 mm calculus is seen in the urinary bladder. Urinary bladder is nondilated.  No pelvic soft tissue masses are identified. Mild lymphadenopathy is seen in the right external iliac chain, with lymph node measuring 16 mm on image 36. A 10 mm lymph node is also seen in the right inguinal region, which is not considered pathologically enlarged.  IMPRESSION: Poorly defined increased density with tiny central air bubbles within the subcutaneous tissues of the right buttock. This is consistent with cellulitis, and gas forming infection cannot be excluded.  No definite abscess or drainable fluid collection identified. No evidence of osteomyelitis.  Mildly enlarged prostate. Nondilated urinary bladder containing 16 mm calculus.  Mild right external iliac lymphadenopathy.   Electronically Signed   By: Myles RosenthalJohn  Stahl M.D.   On: 12/14/2013 17:32    Microbiology: Recent Results (from the past 240 hour(s))  CULTURE, BLOOD (ROUTINE X 2)     Status: None   Collection Time    12/14/13  4:17 PM      Result Value Ref Range Status   Specimen Description BLOOD LEFT FOREARM   Final   Special Requests BOTTLES DRAWN AEROBIC AND ANAEROBIC 8CC   Final   Culture NO GROWTH 3 DAYS   Final   Report Status PENDING   Incomplete  CULTURE, BLOOD (ROUTINE X 2)     Status: None   Collection Time    12/14/13  4:17 PM      Result Value Ref Range Status   Specimen Description BLOOD LEFT HAND   Final   Special Requests BOTTLES DRAWN AEROBIC AND ANAEROBIC 4CC   Final   Culture NO GROWTH 3 DAYS   Final   Report Status PENDING   Incomplete  WOUND CULTURE     Status: None   Collection Time    12/15/13  5:45 AM      Result Value Ref Range Status   Specimen Description WOUND RIGHT BUTTOCK   Final   Special Requests NONE   Final   Gram Stain     Final   Value: NO WBC SEEN     NO SQUAMOUS EPITHELIAL CELLS  SEEN     NO ORGANISMS SEEN     Performed at [facility]   Culture     Final   Value: FEW METHICILLIN RESISTANT STAPHYLOCOCCUS AUREUS     Note: RIFAMPIN AND GENTAMICIN SHOULD NOT BE USED AS SINGLE DRUGS FOR TREATMENT OF STAPH INFECTIONS. This organism DOES NOT demonstrate inducible Clindamycin resistance in vitro. CRITICAL RESULT CALLED TO, READ BACK BY AND VERIFIED WITH: ASHELEY R 6/20      @1045  BY REAMM     Performed at [facility]   Report Status 12/17/2013 FINAL   Final   Organism ID, Bacteria METHICILLIN RESISTANT STAPHYLOCOCCUS AUREUS   Final     Labs: Basic Metabolic Panel:  Recent Labs Lab 12/14/13 1617 12/15/13 0524 12/16/13 0534 12/17/13 0552  NA 142 143 143 142  K 4.4 4.1 4.1 3.9  CL 101 103 104 102  CO2 30 30 31 30   GLUCOSE 105* 76 81 80  BUN 26* 19 19 18   CREATININE 1.12 1.16 1.30 1.21  CALCIUM 9.1 9.6 9.1 9.2   Liver Function Tests:  Recent Labs Lab 12/14/13 1617 12/15/13 0524  AST 20 19  ALT <5 10  ALKPHOS 72 62  BILITOT 0.4 0.5  PROT 6.8 6.9  ALBUMIN 3.3* 3.3*   No results found for this basename: LIPASE, AMYLASE,  in the last 168 hours No results found for this basename: AMMONIA,  in the last 168 hours CBC:  Recent Labs Lab 12/14/13 1617 12/15/13 0524  WBC 6.2 6.7  NEUTROABS 4.4  --   HGB 12.6* 13.4  HCT 35.4* 37.9*  MCV 88.7 89.2  PLT 126* 142*   Cardiac Enzymes: No results found for this basename: CKTOTAL, CKMB, CKMBINDEX, TROPONINI,  in the last 168 hours BNP: BNP (last 3 results) No results found for this basename: PROBNP,  in the last 8760 hours CBG: No results found for this basename: GLUCAP,  in the last 168 hours     Signed:  MEMON,JEHANZEB  [facility] 12/17/2013, 12:53 PM

## 2013-12-17 NOTE — Progress Notes (Signed)
CSW spoke with Marcelino DusterMichelle at [facility]. She stated that per their Director- they do not accept weekend discharges. CSW explained that as soon as the Fl2 program starts to work we can complete meds on Fl2 and fax it to the facility. Marcelino DusterMichelle stated that they have taken the matter to their Administrator and are awaiting a call back. She stated that she would call this CSW once she hears from the Administrator but otherwise- just plan d/c back on Monday if Administrator does not respond.  Will notify patient's nurse of above.  Lorri Frederickonna T. West PughCrowder, LCSWA  [phone]

## 2013-12-17 NOTE — Progress Notes (Signed)
Shift Summary (7p-7a): Pt remained alert and non-verbal, nods appropriately. Pt became slightly agitated, as pt was covering face and head. Nighttime medications given and pt able to rest quietly throughout the shift. No cues observed to indicate pain. Tolerated bedtime snack well. Safety maintained, bed alarm on, no falls/injuries this shift. Condom catheter remains in place, 1 bowel movement this shift. Dressing to rt buttock remains dry/intact. Pt assisted to reposition by staff to prevent breakdown, no changes in skin integrity noted. Lt forearm PIV, flushes without difficulty. IV antibiotics administered as ordered. Vital signs remain stable. Afebrile. Wife remains at bedside, no concerns at this time, will continue with plan of care.  Rolm GalaDarlene Braggs, RN-BC 12/17/2013 6:35 AM

## 2013-12-17 NOTE — Progress Notes (Signed)
12/17/13 1547 Patient to be discharged back to [facility] today. Facility aware of discharge plan and home health RN for wound care arranged per CM and CSW yesterday. Notified Myrene BuddyYvonne at facility of discharge this afternoon. Stated patient may return today. Prescription for doxycycline 100 mg po twice daily ordered at discharge. Myrene BuddyYvonne at facility stated "you need to send his medicine, we can't get it till Monday". Notified nursing supervisor, stated pharmacy may be able to supply doses needed until Monday. Pharmacy stated unable to supply discharge prescription d/t safety concerns. Notified on-call SW, Lovette ClicheDonna Crowder of situation. Stated FL2 system is currently down, unsure when will be able to update FL2. Stated she would call facility to discuss discharge issues. Notified Dr. Kerry HoughMemon, we are awaiting call back from SW. Received call back from [place] at [facility]. Stated "I tried calling that social worker back, but she's not answering. He can't come back without a signed FL2 with the medicines." Notified Dr. Kerry HoughMemon. Awaiting return call from SW. Orders received to switch to doxycycline 100 mg po twice daily, d/c vancomycin and zosyn. Earnstine RegalAshley Rogers, RN

## 2013-12-18 NOTE — Progress Notes (Signed)
[facility] PROGRESS NOTE  Evan Lozano XBJ:[id number] DOB: 1945/12/27 DOA: 12/14/2013 PCP: No PCP Per Patient  Assessment/Plan: 1. Cellulitis and abscess of right buttock. Appreciate wound care input. He is to continue on iodoform packing and dry gauze dressing. His cellulitis appears to be improving. Wound culture positive for MRSA. He will be transitioned to doxycycline to complete course. Was stable for discharge yesterday, but could not return to ALF due to paperwork issues. He is stable for discharge today. 2. Heme positive stools. No evidence of gross bleeding. Hemoglobin is stable. This can be pursued in the outpatient setting. 3. History of Alzheimer's disease. Continue home medications 4. History of Parkinson's disease. Continue home medications  Code Status: DNR Family Communication: Discussed with wife at the bedside Disposition Plan: Discharge back to ALF today   Consultants:  Wound care  Procedures:  Incision and drainage of right gluteal abscess done in the emergency room  Antibiotics:  Vancomycin 6/17>> 6/20  Zosyn 6/17>>6/20  Doxycycline 6/20  HPI/Subjective: Patient is essentially nonverbal. Does not appear to be in any distress. Wife reports that appetite is good.  No new complaints.  Objective: Filed Vitals:   12/18/13 0605  BP: 147/82  Pulse: 81  Temp: 99 F (37.2 C)  Resp: 18    Intake/Output Summary (Last 24 hours) at 12/18/13 1046 Last data filed at 12/18/13 3086  Gross per 24 hour  Intake    480 ml  Output   1300 ml  Net   -820 ml   Filed Weights   12/14/13 1530 12/14/13 2114  Weight: 69.4 kg (153 lb) 57.6 kg (126 lb 15.8 oz)    Exam:   General:  NAD  Cardiovascular: S1, S2, regular rate and rhythm  Respiratory: Clear to auscultation bilaterally  Abdomen: Soft, nontender, positive bowel sounds  Musculoskeletal: No pedal edema bilaterally  Skin: erythema is retracting from margins and is not as pronounced.  Data  Reviewed: Basic Metabolic Panel:  Recent Labs Lab 12/14/13 1617 12/15/13 0524 12/16/13 0534 12/17/13 0552  NA 142 143 143 142  K 4.4 4.1 4.1 3.9  CL 101 103 104 102  CO2 30 30 31 30   GLUCOSE 105* 76 81 80  BUN 26* 19 19 18   CREATININE 1.12 1.16 1.30 1.21  CALCIUM 9.1 9.6 9.1 9.2   Liver Function Tests:  Recent Labs Lab 12/14/13 1617 12/15/13 0524  AST 20 19  ALT <5 10  ALKPHOS 72 62  BILITOT 0.4 0.5  PROT 6.8 6.9  ALBUMIN 3.3* 3.3*   No results found for this basename: LIPASE, AMYLASE,  in the last 168 hours No results found for this basename: AMMONIA,  in the last 168 hours CBC:  Recent Labs Lab 12/14/13 1617 12/15/13 0524  WBC 6.2 6.7  NEUTROABS 4.4  --   HGB 12.6* 13.4  HCT 35.4* 37.9*  MCV 88.7 89.2  PLT 126* 142*   Cardiac Enzymes: No results found for this basename: CKTOTAL, CKMB, CKMBINDEX, TROPONINI,  in the last 168 hours BNP (last 3 results) No results found for this basename: PROBNP,  in the last 8760 hours CBG: No results found for this basename: GLUCAP,  in the last 168 hours  Recent Results (from the past 240 hour(s))  CULTURE, BLOOD (ROUTINE X 2)     Status: None   Collection Time    12/14/13  4:17 PM      Result Value Ref Range Status   Specimen Description BLOOD LEFT FOREARM   Final  Special Requests BOTTLES DRAWN AEROBIC AND ANAEROBIC 8CC   Final   Culture NO GROWTH 3 DAYS   Final   Report Status PENDING   Incomplete  CULTURE, BLOOD (ROUTINE X 2)     Status: None   Collection Time    12/14/13  4:17 PM      Result Value Ref Range Status   Specimen Description BLOOD LEFT HAND   Final   Special Requests BOTTLES DRAWN AEROBIC AND ANAEROBIC 4CC   Final   Culture NO GROWTH 3 DAYS   Final   Report Status PENDING   Incomplete  WOUND CULTURE     Status: None   Collection Time    12/15/13  5:45 AM      Result Value Ref Range Status   Specimen Description WOUND RIGHT BUTTOCK   Final   Special Requests NONE   Final   Gram Stain      Final   Value: NO WBC SEEN     NO SQUAMOUS EPITHELIAL CELLS SEEN     NO ORGANISMS SEEN     Performed at [facility]   Culture     Final   Value: FEW METHICILLIN RESISTANT STAPHYLOCOCCUS AUREUS     Note: RIFAMPIN AND GENTAMICIN SHOULD NOT BE USED AS SINGLE DRUGS FOR TREATMENT OF STAPH INFECTIONS. This organism DOES NOT demonstrate inducible Clindamycin resistance in vitro. CRITICAL RESULT CALLED TO, READ BACK BY AND VERIFIED WITH: ASHELEY R 6/20      @1045  BY REAMM     Performed at [facility]   Report Status 12/17/2013 FINAL   Final   Organism ID, Bacteria METHICILLIN RESISTANT STAPHYLOCOCCUS AUREUS   Final     Studies: No results found.  Scheduled Meds: . ALPRAZolam  0.5 mg Oral BID  . aspirin  81 mg Oral Daily  . busPIRone  15 mg Oral BID  . carbidopa-levodopa  1 tablet Oral BID WC  . doxycycline  100 mg Oral Q12H  . galantamine  4 mg Oral BID  . memantine  20 mg Oral Daily  . mirtazapine  15 mg Oral QHS  . pantoprazole  40 mg Oral Q1200  . QUEtiapine  25 mg Oral QHS  . sertraline  50 mg Oral Daily   Continuous Infusions:   Principal Problem:   Cellulitis and abscess of buttock Active Problems:   Heme positive stool   Alzheimer's dementia   Parkinson disease    Time spent:    [facility]  [facility] Pager [phone]. If 7PM-7AM, please contact night-coverage at [email or web address], password [facility] 12/18/2013, 10:46 AM  LOS: 4 days

## 2013-12-18 NOTE — Progress Notes (Signed)
Patient with orders to be discharge to [facility]. Discharge packet sent with patient. Report called to [facility], Consuella LoseEvonne, Nurse. Patient stable. Patient transported in private vehicle with spouse.

## 2013-12-19 LAB — CULTURE, BLOOD (ROUTINE X 2)
Culture: NO GROWTH
Culture: NO GROWTH

## 2013-12-26 ENCOUNTER — Emergency Department (HOSPITAL_COMMUNITY)
Admission: EM | Admit: 2013-12-26 | Discharge: 2013-12-26 | Disposition: A | Discharge status: Home / Self Care | Payer: Medicare Other | Attending: Emergency Medicine | Admitting: Emergency Medicine

## 2013-12-26 ENCOUNTER — Encounter (HOSPITAL_COMMUNITY): Payer: Self-pay | Admitting: Emergency Medicine

## 2013-12-26 ENCOUNTER — Emergency Department (HOSPITAL_COMMUNITY): Payer: Medicare Other

## 2013-12-26 DIAGNOSIS — E785 Hyperlipidemia, unspecified: Secondary | ICD-10-CM | POA: Diagnosis not present

## 2013-12-26 DIAGNOSIS — Z792 Long term (current) use of antibiotics: Secondary | ICD-10-CM | POA: Diagnosis not present

## 2013-12-26 DIAGNOSIS — Z862 Personal history of diseases of the blood and blood-forming organs and certain disorders involving the immune mechanism: Secondary | ICD-10-CM | POA: Diagnosis not present

## 2013-12-26 DIAGNOSIS — I10 Essential (primary) hypertension: Secondary | ICD-10-CM | POA: Insufficient documentation

## 2013-12-26 DIAGNOSIS — F0280 Dementia in other diseases classified elsewhere without behavioral disturbance: Secondary | ICD-10-CM | POA: Diagnosis not present

## 2013-12-26 DIAGNOSIS — R509 Fever, unspecified: Secondary | ICD-10-CM | POA: Diagnosis present

## 2013-12-26 DIAGNOSIS — Z79899 Other long term (current) drug therapy: Secondary | ICD-10-CM | POA: Diagnosis not present

## 2013-12-26 DIAGNOSIS — G309 Alzheimer's disease, unspecified: Secondary | ICD-10-CM | POA: Insufficient documentation

## 2013-12-26 DIAGNOSIS — Z87448 Personal history of other diseases of urinary system: Secondary | ICD-10-CM | POA: Diagnosis not present

## 2013-12-26 DIAGNOSIS — Z7982 Long term (current) use of aspirin: Secondary | ICD-10-CM | POA: Insufficient documentation

## 2013-12-26 DIAGNOSIS — L0231 Cutaneous abscess of buttock: Secondary | ICD-10-CM | POA: Diagnosis not present

## 2013-12-26 DIAGNOSIS — A4902 Methicillin resistant Staphylococcus aureus infection, unspecified site: Secondary | ICD-10-CM | POA: Insufficient documentation

## 2013-12-26 DIAGNOSIS — L03317 Cellulitis of buttock: Principal | ICD-10-CM

## 2013-12-26 HISTORY — DX: Parkinson's disease without dyskinesia, without mention of fluctuations: G20.A1

## 2013-12-26 HISTORY — DX: Anemia, unspecified: D64.9

## 2013-12-26 HISTORY — DX: Parkinson's disease: G20

## 2013-12-26 LAB — CBC WITH DIFFERENTIAL/PLATELET
Basophils Absolute: 0.1 10*3/uL (ref 0.0–0.1)
Basophils Relative: 1 % (ref 0–1)
Eosinophils Absolute: 0.2 10*3/uL (ref 0.0–0.7)
Eosinophils Relative: 2 % (ref 0–5)
HCT: 39.2 % (ref 39.0–52.0)
HEMOGLOBIN: 14.2 g/dL (ref 13.0–17.0)
Lymphocytes Relative: 15 % (ref 12–46)
Lymphs Abs: 1.1 10*3/uL (ref 0.7–4.0)
MCH: 32.1 pg (ref 26.0–34.0)
MCHC: 36.2 g/dL — ABNORMAL HIGH (ref 30.0–36.0)
MCV: 88.5 fL (ref 78.0–100.0)
MONO ABS: 0.5 10*3/uL (ref 0.1–1.0)
MONOS PCT: 7 % (ref 3–12)
NEUTROS ABS: 5.7 10*3/uL (ref 1.7–7.7)
NEUTROS PCT: 75 % (ref 43–77)
Platelets: 166 10*3/uL (ref 150–400)
RBC: 4.43 MIL/uL (ref 4.22–5.81)
RDW: 14.6 % (ref 11.5–15.5)
WBC: 7.6 10*3/uL (ref 4.0–10.5)

## 2013-12-26 LAB — URINE MICROSCOPIC-ADD ON

## 2013-12-26 LAB — COMPREHENSIVE METABOLIC PANEL
ALK PHOS: 72 U/L (ref 39–117)
ALT: 10 U/L (ref 0–53)
AST: 26 U/L (ref 0–37)
Albumin: 3.6 g/dL (ref 3.5–5.2)
BILIRUBIN TOTAL: 0.3 mg/dL (ref 0.3–1.2)
BUN: 21 mg/dL (ref 6–23)
CO2: 32 meq/L (ref 19–32)
Calcium: 9.4 mg/dL (ref 8.4–10.5)
Chloride: 101 mEq/L (ref 96–112)
Creatinine, Ser: 1.18 mg/dL (ref 0.50–1.35)
GFR, EST AFRICAN AMERICAN: 72 mL/min — AB (ref >90)
GFR, EST NON AFRICAN AMERICAN: 62 mL/min — AB (ref >90)
GLUCOSE: 102 mg/dL — AB (ref 70–99)
POTASSIUM: 4.1 meq/L (ref 3.7–5.3)
Sodium: 142 mEq/L (ref 137–147)
TOTAL PROTEIN: 7.1 g/dL (ref 6.0–8.3)

## 2013-12-26 LAB — URINALYSIS, ROUTINE W REFLEX MICROSCOPIC
BILIRUBIN URINE: NEGATIVE
Glucose, UA: NEGATIVE mg/dL
KETONES UR: NEGATIVE mg/dL
Leukocytes, UA: NEGATIVE
NITRITE: NEGATIVE
PROTEIN: NEGATIVE mg/dL
SPECIFIC GRAVITY, URINE: 1.01 (ref 1.005–1.030)
UROBILINOGEN UA: 0.2 mg/dL (ref 0.0–1.0)
pH: 6 (ref 5.0–8.0)

## 2013-12-26 LAB — I-STAT CG4 LACTIC ACID, ED: LACTIC ACID, VENOUS: 1.98 mmol/L (ref 0.5–2.2)

## 2013-12-26 MED ORDER — SODIUM CHLORIDE 0.9 % IV BOLUS (SEPSIS)
30.0000 mL/kg | Freq: Once | INTRAVENOUS | Status: AC
  Administered 2013-12-26: 1000 mL via INTRAVENOUS

## 2013-12-26 MED ORDER — SODIUM CHLORIDE 0.9 % IV SOLN
1000.0000 mL | INTRAVENOUS | Status: DC
  Administered 2013-12-26: 1000 mL via INTRAVENOUS

## 2013-12-26 NOTE — Discharge Instructions (Signed)
The patient has a well healing abscess on his buttock. There is no fever  present here in the emergency department. Labs and other workup are normal. He should continue his antibiotics as previously prescribed and followup with his primary care doctor.

## 2013-12-26 NOTE — ED Provider Notes (Signed)
CSN: [id number]     Arrival date & time 12/26/13  1611 History  This chart was scribed for Hilario Quarryanielle S Ray, MD,  by Ashley JacobsBrittany Andrews, ED Scribe. The patient was seen in room APA17/APA17 and the patient's care was started at 4:39 PM.   First MD Initiated Contact with Patient 12/26/13 1622     Chief Complaint  Patient presents with  . Fever     (Consider location/radiation/quality/duration/timing/severity/associated sxs/prior Treatment)  LEVEL 5 CAVEAT: ALZHEIMER DISEASE Patient is a 68 y.o. male presenting with fever. The history is provided by the nursing home, medical records and the spouse.  Fever Max temp prior to arrival:  102.2 Temp source:  Oral Severity:  Moderate Onset quality:  Sudden Timing:  Constant Chronicity:  New Relieved by:  Nothing Worsened by:  Nothing tried Ineffective treatments:  None tried Associated symptoms: no vomiting    HPI Comments: Evan Lozano is a 68 y.o. male whose wife presents him to the Emergency Department via EMS [facility] complaining of constant fever of 102.2 taken orally today. Pt does not verbally communicate at baseline. Per wife, pt was normal yesterday when she saw him. He has a boil on his right buttock and was seen eight days ago. He had an incision and drainage to the site.  Pt was treated for MRSA and instructed to keep the site clean and dressed.  He is able to walk at baseline. Pt is unable to feed himself and is able to swallow normally. Denies cough. Denies vomiting and appetite changes. He wears Depends and has to be changed by nurses.  Pt smoke tobacco years ago.    Past Medical History  Diagnosis Date  . Alzheimer disease   . Hypertension   . Dementia   . BPH (benign prostatic hyperplasia)   . HLD (hyperlipidemia)   . Normocytic anemia   . Parkinson disease    Past Surgical History  Procedure Laterality Date  . Rectal surgery     Family History  Problem Relation Age of Onset  . Alzheimer's  disease Father    History  Substance Use Topics  . Smoking status: Never Smoker   . Smokeless tobacco: Not on file  . Alcohol Use: No    Review of Systems  Constitutional: Positive for fever. Negative for appetite change.  HENT: Negative for trouble swallowing.   Gastrointestinal: Negative for vomiting.  Skin: Positive for color change and wound.  All other systems reviewed and are negative.     Allergies  Review of patient's allergies indicates no known allergies.  Home Medications   Prior to Admission medications   Medication Sig Start Date End Date Taking? Authorizing Edrick Whitehorn  ALPRAZolam Prudy Feeler(XANAX) 0.5 MG tablet Take 0.5 mg by mouth 2 (two) times daily.     Historical Ismail Graziani, MD  aspirin 81 MG chewable tablet Chew 81 mg by mouth daily.    Historical Kaili Castille, MD  B Complex-Biotin-FA (B-COMPLEX PO) Take 1 tablet by mouth daily.    Historical Fabienne Nolasco, MD  busPIRone (BUSPAR) 15 MG tablet Take 15 mg by mouth 2 (two) times daily.    Historical Itzel Lowrimore, MD  carbidopa-levodopa (SINEMET IR) 25-100 MG per tablet Take 1 tablet by mouth 2 (two) times daily.     Historical Dayzha Pogosyan, MD  cetirizine (ZYRTEC) 10 MG tablet Take 10 mg by mouth daily as needed for allergies.    Historical Elani Delph, MD  doxycycline (VIBRA-TABS) 100 MG tablet Take 1 tablet (100 mg total) by  mouth 2 (two) times daily. 12/17/13   Erick BlinksJehanzeb Memon, MD  galantamine (RAZADYNE) 4 MG tablet Take 4 mg by mouth 2 (two) times daily.    Historical Angelyne Terwilliger, MD  Infant Care Products (DERMACLOUD) CREA Apply 1 application topically 3 (three) times daily as needed. rash    Historical Anice Wilshire, MD  memantine (NAMENDA) 10 MG tablet Take 20 mg by mouth daily.    Historical Demetric Parslow, MD  mirtazapine (REMERON) 15 MG tablet Take 15 mg by mouth at bedtime.    Historical Siara Gorder, MD  Omega-3 Fatty Acids (FISH OIL) 1200 MG CAPS Take 1 capsule by mouth daily.    Historical Zerline Melchior, MD  pantoprazole (PROTONIX) 40 MG tablet Take 1 tablet  (40 mg total) by mouth daily at 12 noon. 12/17/13   Erick BlinksJehanzeb Memon, MD  QUEtiapine (SEROQUEL) 25 MG tablet Take 25 mg by mouth at bedtime.    Historical Khaleb Broz, MD  selenium sulfide (ANTI-DANDRUFF) 1 % LOTN Apply 1 application topically 3 (three) times a week.    Historical Michaela Broski, MD  sertraline (ZOLOFT) 50 MG tablet Take 50 mg by mouth daily.    Historical Kahlea Cobert, MD  vitamin C (ASCORBIC ACID) 500 MG tablet Take 500 mg by mouth daily.    Historical Jaylynn Siefert, MD  vitamin E 400 UNIT capsule Take 400 Units by mouth daily.    Historical Dashiell Franchino, MD   BP 153/70  Pulse 68  Temp(Src) 98.3 F (36.8 C) (Rectal)  Resp 18  Ht 5\' 7"  (1.702 m)  Wt 117 lb (53.071 kg)  BMI 18.32 kg/m2  SpO2 100% Physical Exam  Nursing note and vitals reviewed. Constitutional: He is oriented to person, place, and time. He appears well-developed and well-nourished. No distress.  HENT:  Head: Normocephalic and atraumatic.  Eyes: Conjunctivae and EOM are normal.  Neck: Neck supple. No tracheal deviation present.  Cardiovascular: Normal rate.   Pulmonary/Chest: Effort normal. No respiratory distress.  Musculoskeletal: Normal range of motion.  Neurological: He is alert and oriented to person, place, and time.  Skin: Skin is warm and dry. There is erythema.   Pressure ulcer on right buttock. Incision and drainage previously performed. The site is open not actively draining. Surrounding erythema 3 cm circumferentially around affected area.   Psychiatric: He has a normal mood and affect. His behavior is normal.    ED Course  Procedures (including critical care time) DIAGNOSTIC STUDIES: Oxygen Saturation is 100% on room air, normal by my interpretation.    COORDINATION OF CARE:  4:46 PM Discussed course of care with pt's wife which includes rectal temperature and laboratory tests . Pt's wife understands and agrees.    Labs Review Labs Reviewed  CBC WITH DIFFERENTIAL - Abnormal; Notable for the following:     MCHC 36.2 (*)    All other components within normal limits  COMPREHENSIVE METABOLIC PANEL - Abnormal; Notable for the following:    Glucose, Bld 102 (*)    GFR calc non Af Amer 62 (*)    GFR calc Af Amer 72 (*)    All other components within normal limits  CULTURE, BLOOD (ROUTINE X 2)  CULTURE, BLOOD (ROUTINE X 2)  URINE CULTURE  URINALYSIS, ROUTINE W REFLEX MICROSCOPIC  I-STAT CG4 LACTIC ACID, ED    Imaging Review Dg Chest Port 1 View  12/26/2013   CLINICAL DATA:  Fever  EXAM: PORTABLE CHEST - 1 VIEW  COMPARISON:  August 04, 2013  FINDINGS: The heart size and mediastinal contours are within normal limits. There  is no focal infiltrate, pulmonary edema, or pleural effusion. The visualized skeletal structures are unremarkable.  IMPRESSION: No active cardiopulmonary disease.   Electronically Signed   By: Sherian Rein M.D.   On: 12/26/2013 17:36     EKG Interpretation   Date/Time:  Monday December 26 2013 17:04:55 EDT Ventricular Rate:  66 PR Interval:  165 QRS Duration: 80 QT Interval:  394 QTC Calculation: 413 R Axis:   85 Text Interpretation:  Normal sinus rhythm COMPARED WITH first prior ekg  rate has decreased.  Confirmed by RAY MD, Duwayne Heck [phone]) on 12/26/2013  7:01:21 PM      MDM   Final diagnoses:  Abscess of left buttock  Abscess of right buttock  I personally performed the services described in this documentation, which was scribed in my presence. The recorded information has been reviewed and considered.  Mr. Mcglocklin is a 68 year old gentleman with a history of Parkinson's and dementia recently discharged with an abscess on the right buttock that was positive for MRSA. Reportedly the staff told his wife today that he had a fever to 102. There was no intervention done. The patient was transported via EMS here. He is unable to give any history due to his dementia. He did not have a fever orally or rectally here. Physical exam reveals well healing abscess with no  drainage to the buttock. Assessment for other infections including urine and chest showed no evidence of infection. Patient has a normal white blood cell count and normal lactic acid that has remained hemodynamically stable here. He is discharged back to the facility to continue the medications he has been on in followup as previously planned.  Hilario Quarry, MD 12/26/13 [phone]

## 2013-12-26 NOTE — ED Notes (Signed)
Per EMS[facility] reports fever 102.2 oral today. Pt not responding verbally to RN at present (baseline) but following commands.

## 2013-12-27 ENCOUNTER — Emergency Department (HOSPITAL_COMMUNITY)
Admission: EM | Admit: 2013-12-27 | Discharge: 2013-12-27 | Disposition: A | Discharge status: Home / Self Care | Payer: Medicare Other | Attending: Emergency Medicine | Admitting: Emergency Medicine

## 2013-12-27 ENCOUNTER — Encounter (HOSPITAL_COMMUNITY): Payer: Self-pay | Admitting: Emergency Medicine

## 2013-12-27 DIAGNOSIS — G20 Parkinson's disease: Secondary | ICD-10-CM | POA: Insufficient documentation

## 2013-12-27 DIAGNOSIS — I10 Essential (primary) hypertension: Secondary | ICD-10-CM | POA: Insufficient documentation

## 2013-12-27 DIAGNOSIS — N400 Enlarged prostate without lower urinary tract symptoms: Secondary | ICD-10-CM | POA: Insufficient documentation

## 2013-12-27 DIAGNOSIS — Z7982 Long term (current) use of aspirin: Secondary | ICD-10-CM | POA: Diagnosis not present

## 2013-12-27 DIAGNOSIS — G20A1 Parkinson's disease without dyskinesia, without mention of fluctuations: Secondary | ICD-10-CM | POA: Insufficient documentation

## 2013-12-27 DIAGNOSIS — Z5189 Encounter for other specified aftercare: Secondary | ICD-10-CM

## 2013-12-27 DIAGNOSIS — Z4800 Encounter for change or removal of nonsurgical wound dressing: Secondary | ICD-10-CM | POA: Insufficient documentation

## 2013-12-27 DIAGNOSIS — Z862 Personal history of diseases of the blood and blood-forming organs and certain disorders involving the immune mechanism: Secondary | ICD-10-CM | POA: Diagnosis not present

## 2013-12-27 DIAGNOSIS — Z79899 Other long term (current) drug therapy: Secondary | ICD-10-CM | POA: Insufficient documentation

## 2013-12-27 DIAGNOSIS — F0280 Dementia in other diseases classified elsewhere without behavioral disturbance: Secondary | ICD-10-CM | POA: Diagnosis not present

## 2013-12-27 DIAGNOSIS — G309 Alzheimer's disease, unspecified: Secondary | ICD-10-CM | POA: Insufficient documentation

## 2013-12-27 LAB — URINE CULTURE
Colony Count: NO GROWTH
Culture: NO GROWTH

## 2013-12-27 NOTE — Discharge Instructions (Signed)
The wound to Evan Lozano buttock is healing without apparent complication. The healing process is not quick. He does not need specialized wound care though. It needs to be kept covered and observed. Continued wound care simply needs a loose plain gauze dressing and tape to secure it. It needs to be changed once daily, with bathing or if gets wet. He does not require hospitalization or specialized nursing care.

## 2013-12-27 NOTE — ED Notes (Addendum)
Pt. Wife stating "are [facility] going to [facility]? They told me that they are sending him here so he can go somewhere else. They told me that they won't accept him back there. Something is not right. They told me he had a fever of 102 but when he got here he had no fever, his temperature couldn't have changed that quickly"

## 2013-12-27 NOTE — ED Notes (Signed)
[facility] to [facility]. Spoke with Tshira. Informed staff that wife will be transporting pt. Back to facility. Advised that he is to continue current antibiotic regimen.

## 2013-12-27 NOTE — ED Notes (Signed)
Spoke to [place] at [facility] and gave patients discharge instructions for his wound and informed her that the patients wife would transport him back to their facility, she stated that was fine.

## 2013-12-27 NOTE — ED Provider Notes (Signed)
CSN: [id number]     Arrival date & time 12/27/13  0005 History  This chart was scribed for Raeford RazorStephen Kohut, MD,  by Ashley JacobsBrittany Andrews, ED Scribe. The patient was seen in room APA19/APA19 and the patient's care was started at 12:31 AM.   First MD Initiated Contact with Patient 12/27/13 0024     Chief Complaint  Patient presents with  . Wound Check     (Consider location/radiation/quality/duration/timing/severity/associated sxs/prior Treatment) Patient is a 68 y.o. male presenting with wound check. The history is provided by medical records and the spouse. No language interpreter was used.  Wound Check   HPI Comments: Evan Lozano is a 68 y.o. male whose wife  presents him to the Emergency Department for a wound check. Pt's wife, mentions the [facility] stated that pt need to be admitted to the hospital because of the open wound on his buttock. The staff was not bandaging his wound as instructed.The supervisor mentions that the site is draining and they are concerned about possible spread of infection. Pt also has jock itch. He was seen at the ED earlier today Past Medical History  Diagnosis Date  . Alzheimer disease   . Hypertension   . Dementia   . BPH (benign prostatic hyperplasia)   . HLD (hyperlipidemia)   . Normocytic anemia   . Parkinson disease    Past Surgical History  Procedure Laterality Date  . Rectal surgery     Family History  Problem Relation Age of Onset  . Alzheimer's disease Father    History  Substance Use Topics  . Smoking status: Never Smoker   . Smokeless tobacco: Not on file  . Alcohol Use: No    Review of Systems  Skin: Positive for color change, rash and wound.  All other systems reviewed and are negative.     Allergies  Review of patient's allergies indicates no known allergies.  Home Medications   Prior to Admission medications   Medication Sig Start Date End Date Taking? Authorizing Provider  ALPRAZolam Prudy Feeler(XANAX) 0.5 MG tablet Take  0.5 mg by mouth 2 (two) times daily.    Yes Historical Provider, MD  aspirin 81 MG chewable tablet Chew 81 mg by mouth daily.   Yes Historical Provider, MD  B Complex-Biotin-FA (B-COMPLEX PO) Take 1 tablet by mouth daily.   Yes Historical Provider, MD  busPIRone (BUSPAR) 15 MG tablet Take 15 mg by mouth 2 (two) times daily.   Yes Historical Provider, MD  carbidopa-levodopa (SINEMET IR) 25-100 MG per tablet Take 1 tablet by mouth 2 (two) times daily.    Yes Historical Provider, MD  cetirizine (ZYRTEC) 10 MG tablet Take 10 mg by mouth daily as needed for allergies.   Yes Historical Provider, MD  doxycycline (VIBRA-TABS) 100 MG tablet Take 1 tablet (100 mg total) by mouth 2 (two) times daily. 12/17/13  Yes Erick BlinksJehanzeb Memon, MD  galantamine (RAZADYNE) 4 MG tablet Take 4 mg by mouth 2 (two) times daily.   Yes Historical Provider, MD  memantine (NAMENDA) 10 MG tablet Take 20 mg by mouth daily.   Yes Historical Provider, MD  mirtazapine (REMERON) 15 MG tablet Take 15 mg by mouth at bedtime.   Yes Historical Provider, MD  Omega-3 Fatty Acids (FISH OIL) 1200 MG CAPS Take 1 capsule by mouth daily.   Yes Historical Provider, MD  pantoprazole (PROTONIX) 40 MG tablet Take 1 tablet (40 mg total) by mouth daily at 12 noon. 12/17/13  Yes Erick BlinksJehanzeb Memon, MD  QUEtiapine (SEROQUEL) 25 MG tablet Take 25 mg by mouth at bedtime.   Yes Historical Provider, MD  selenium sulfide (ANTI-DANDRUFF) 1 % LOTN Apply 1 application topically 3 (three) times a week.   Yes Historical Provider, MD  sertraline (ZOLOFT) 50 MG tablet Take 50 mg by mouth daily.   Yes Historical Provider, MD  vitamin C (ASCORBIC ACID) 500 MG tablet Take 500 mg by mouth daily.   Yes Historical Provider, MD  vitamin E 400 UNIT capsule Take 400 Units by mouth daily.   Yes Historical Provider, MD   BP 139/70  Pulse 64  Temp(Src) 97.7 F (36.5 C) (Oral)  Resp 20  SpO2 98% Physical Exam  Nursing note and vitals reviewed. Constitutional: He appears  well-developed and well-nourished.  HENT:  Head: Normocephalic.  Eyes: Pupils are equal, round, and reactive to light.  Neck: Normal range of motion.  Cardiovascular: Normal rate, regular rhythm and normal heart sounds.   Pulmonary/Chest: Effort normal. No respiratory distress.  Abdominal: Soft.  Neurological: He is alert.  Resting tremor. Nonverbal. Trying to get up from bed several times. No focal motor deficit noted.   Skin: Skin is warm and dry. He is not diaphoretic. There is erythema.  Healing wound to right upper buttock consistent with abscess that was incised and drainged  Bandage that was applied at last visit is dry. Wound appears to be well healing.  No drainage.  No cellulitis.     ED Course  Procedures (including critical care time) DIAGNOSTIC STUDIES: Oxygen Saturation is 98% on room air, normal by my interpretation.    COORDINATION OF CARE:  12:27 AM Discussed course of care with pt's wife . Pt's wife understands and agrees.   Labs Review Labs Reviewed - No data to display  Imaging Review Dg Chest Port 1 View  12/26/2013   CLINICAL DATA:  Fever  EXAM: PORTABLE CHEST - 1 VIEW  COMPARISON:  August 04, 2013  FINDINGS: The heart size and mediastinal contours are within normal limits. There is no focal infiltrate, pulmonary edema, or pleural effusion. The visualized skeletal structures are unremarkable.  IMPRESSION: No active cardiopulmonary disease.   Electronically Signed   By: Sherian ReinWei-Chen  Lin M.D.   On: 12/26/2013 17:36     EKG Interpretation None      MDM   Final diagnoses:  Encounter for wound re-check    Evan Lozano is returning to ED for evaluation of wound which appears to be healing well. It is open, but fairly small in size and has healthy appearing granulation tissue around base. No drainage or surrounding cellulitis. Dressing that was applied during ED visit earlier is dry.  Remains afebrile. Previous work-up reviewed. I find no indication for further  testing. Evan Lozano has numerous needs with his Alzheimer's and other medical conditions. Considering the scope of his needs, his required wound care is comparatively simple.   Tried discussing this with staff at facility.   Spoke with [place] at [facility] [phone]). She did not relay any specific issues other than essentially "we can't keep him here." Requested I speak with Darnell Levelrystal Martin, supervisor. [phone]). No answer. Message left. Called [place] again explaining my impression and that Evan. Evan Lozano would be discharged back to their facility tonight.     I personally preformed the services scribed in my presence. The recorded information has been reviewed is accurate. Raeford RazorStephen Kohut, MD.    Raeford RazorStephen Kohut, MD 12/27/13 [phone]

## 2013-12-27 NOTE — ED Notes (Signed)
Patient was seen in ED earlier today for draining leg wound and fever.  Patient was [facility] back to [facility] and was sent back from [facility] because the supervisor states they cannot take care of him because he has a draining wound.

## 2014-01-03 LAB — CULTURE, BLOOD (ROUTINE X 2)
CULTURE: NO GROWTH
Culture: NO GROWTH

## 2014-05-15 ENCOUNTER — Encounter (HOSPITAL_COMMUNITY): Payer: Self-pay

## 2014-05-15 ENCOUNTER — Emergency Department (HOSPITAL_COMMUNITY)
Admission: EM | Admit: 2014-05-15 | Discharge: 2014-05-15 | Disposition: A | Discharge status: Home / Self Care | Payer: Medicare Other | Attending: Emergency Medicine | Admitting: Emergency Medicine

## 2014-05-15 DIAGNOSIS — G20 Parkinson's disease: Secondary | ICD-10-CM | POA: Diagnosis not present

## 2014-05-15 DIAGNOSIS — Z87448 Personal history of other diseases of urinary system: Secondary | ICD-10-CM | POA: Insufficient documentation

## 2014-05-15 DIAGNOSIS — Z862 Personal history of diseases of the blood and blood-forming organs and certain disorders involving the immune mechanism: Secondary | ICD-10-CM | POA: Diagnosis not present

## 2014-05-15 DIAGNOSIS — Z792 Long term (current) use of antibiotics: Secondary | ICD-10-CM | POA: Insufficient documentation

## 2014-05-15 DIAGNOSIS — Z7982 Long term (current) use of aspirin: Secondary | ICD-10-CM | POA: Diagnosis not present

## 2014-05-15 DIAGNOSIS — Z79899 Other long term (current) drug therapy: Secondary | ICD-10-CM | POA: Diagnosis not present

## 2014-05-15 DIAGNOSIS — E785 Hyperlipidemia, unspecified: Secondary | ICD-10-CM | POA: Insufficient documentation

## 2014-05-15 DIAGNOSIS — I10 Essential (primary) hypertension: Secondary | ICD-10-CM | POA: Insufficient documentation

## 2014-05-15 DIAGNOSIS — F0281 Dementia in other diseases classified elsewhere with behavioral disturbance: Secondary | ICD-10-CM | POA: Insufficient documentation

## 2014-05-15 DIAGNOSIS — G309 Alzheimer's disease, unspecified: Secondary | ICD-10-CM | POA: Insufficient documentation

## 2014-05-15 DIAGNOSIS — L259 Unspecified contact dermatitis, unspecified cause: Secondary | ICD-10-CM | POA: Diagnosis not present

## 2014-05-15 DIAGNOSIS — R51 Headache: Secondary | ICD-10-CM | POA: Diagnosis present

## 2014-05-15 LAB — URINALYSIS, ROUTINE W REFLEX MICROSCOPIC
BILIRUBIN URINE: NEGATIVE
Glucose, UA: NEGATIVE mg/dL
Hgb urine dipstick: NEGATIVE
KETONES UR: NEGATIVE mg/dL
Leukocytes, UA: NEGATIVE
NITRITE: NEGATIVE
Protein, ur: NEGATIVE mg/dL
Specific Gravity, Urine: 1.02 (ref 1.005–1.030)
UROBILINOGEN UA: 0.2 mg/dL (ref 0.0–1.0)
pH: 6 (ref 5.0–8.0)

## 2014-05-15 LAB — COMPREHENSIVE METABOLIC PANEL
ALT: 5 U/L (ref 0–53)
AST: 21 U/L (ref 0–37)
Albumin: 3.7 g/dL (ref 3.5–5.2)
Alkaline Phosphatase: 74 U/L (ref 39–117)
Anion gap: 9 (ref 5–15)
BUN: 26 mg/dL — ABNORMAL HIGH (ref 6–23)
CO2: 29 meq/L (ref 19–32)
Calcium: 9.3 mg/dL (ref 8.4–10.5)
Chloride: 103 mEq/L (ref 96–112)
Creatinine, Ser: 1.11 mg/dL (ref 0.50–1.35)
GFR calc Af Amer: 77 mL/min — ABNORMAL LOW (ref >90)
GFR, EST NON AFRICAN AMERICAN: 66 mL/min — AB (ref >90)
Glucose, Bld: 96 mg/dL (ref 70–99)
Potassium: 3.9 mEq/L (ref 3.7–5.3)
Sodium: 141 mEq/L (ref 137–147)
TOTAL PROTEIN: 6.8 g/dL (ref 6.0–8.3)
Total Bilirubin: 0.6 mg/dL (ref 0.3–1.2)

## 2014-05-15 LAB — CBC WITH DIFFERENTIAL/PLATELET
Basophils Absolute: 0 10*3/uL (ref 0.0–0.1)
Basophils Relative: 0 % (ref 0–1)
Eosinophils Absolute: 0.4 10*3/uL (ref 0.0–0.7)
Eosinophils Relative: 5 % (ref 0–5)
HEMATOCRIT: 35.7 % — AB (ref 39.0–52.0)
HEMOGLOBIN: 13.1 g/dL (ref 13.0–17.0)
Lymphocytes Relative: 14 % (ref 12–46)
Lymphs Abs: 1.1 10*3/uL (ref 0.7–4.0)
MCH: 32.1 pg (ref 26.0–34.0)
MCHC: 36.7 g/dL — ABNORMAL HIGH (ref 30.0–36.0)
MCV: 87.5 fL (ref 78.0–100.0)
Monocytes Absolute: 0.4 10*3/uL (ref 0.1–1.0)
Monocytes Relative: 5 % (ref 3–12)
NEUTROS ABS: 5.7 10*3/uL (ref 1.7–7.7)
Neutrophils Relative %: 76 % (ref 43–77)
Platelets: 90 10*3/uL — ABNORMAL LOW (ref 150–400)
RBC: 4.08 MIL/uL — ABNORMAL LOW (ref 4.22–5.81)
RDW: 14.3 % (ref 11.5–15.5)
WBC: 7.6 10*3/uL (ref 4.0–10.5)

## 2014-05-15 MED ORDER — FAMOTIDINE 20 MG PO TABS
20.0000 mg | ORAL_TABLET | Freq: Once | ORAL | Status: AC
  Administered 2014-05-15: 20 mg via ORAL
  Filled 2014-05-15: qty 1

## 2014-05-15 MED ORDER — FAMOTIDINE 20 MG PO TABS: 20.0000 mg | ORAL_TABLET | Freq: Two times a day (BID) | ORAL | Status: AC

## 2014-05-15 MED ORDER — PREDNISONE 10 MG PO TABS: ORAL_TABLET | ORAL | Status: DC

## 2014-05-15 MED ORDER — DIPHENHYDRAMINE HCL 25 MG PO TABS: 50.0000 mg | ORAL_TABLET | ORAL | Status: AC | PRN

## 2014-05-15 MED ORDER — METHYLPREDNISOLONE SODIUM SUCC 125 MG IJ SOLR
125.0000 mg | Freq: Once | INTRAMUSCULAR | Status: AC
  Administered 2014-05-15: 125 mg via INTRAMUSCULAR
  Filled 2014-05-15: qty 2

## 2014-05-15 MED ORDER — DIPHENHYDRAMINE HCL 50 MG/ML IJ SOLN
50.0000 mg | Freq: Once | INTRAMUSCULAR | Status: AC
  Administered 2014-05-15: 50 mg via INTRAMUSCULAR
  Filled 2014-05-15: qty 1

## 2014-05-15 NOTE — ED Notes (Signed)
Pt here from [facility] for evaluation of a rash

## 2014-05-15 NOTE — ED Provider Notes (Signed)
CSN: [id number]     Arrival date & time 05/15/14  1222 History   This chart was scribed for Ward Givens, MD by Abel Presto, ED Scribe. This patient was seen in room APA04/APA04 and the patient's care was started at 1:45 PM.    Chief Complaint  Patient presents with  . Rash   LEVEL 5 CAVEAT FOR DEMENTIA.  The history is provided by the patient. The history is limited by the condition of the patient. No language interpreter was used.     HPI Comments: Evan Lozano is a 68 y.o. male who presents to the Emergency Department complaining of itchy rash for at least two weeks. Pt sent from [facility]  Per their records patient was started on triamcinolone cream on November 3 for 7 days, it was refilled on the 14th. Per nursing home the rashes not improving. Pt was treated for scabies earlier in the year.   PCP Dr Molli Hazard Ward  Patient is DO NOT RESUSCITATE  Past Medical History  Diagnosis Date  . Alzheimer disease   . Hypertension   . Dementia   . BPH (benign prostatic hyperplasia)   . HLD (hyperlipidemia)   . Normocytic anemia   . Parkinson disease    Past Surgical History  Procedure Laterality Date  . Rectal surgery     Family History  Problem Relation Age of Onset  . Alzheimer's disease Father    History  Substance Use Topics  . Smoking status: Never Smoker   . Smokeless tobacco: Not on file  . Alcohol Use: No   lives in [facility] memory care x 3 years  Review of Systems  Unable to perform ROS: Dementia   A complete 10 system review of systems was obtained and all systems are negative except as noted in the HPI and PMH.    Allergies  Review of patient's allergies indicates no known allergies.  Home Medications   Prior to Admission medications   Medication Sig Start Date End Date Taking? Authorizing Provider  ALPRAZolam Prudy Feeler) 0.5 MG tablet Take 0.5 mg by mouth 2 (two) times daily.     Historical Provider, MD  aspirin 81 MG chewable tablet Chew 81 mg  by mouth daily.    Historical Provider, MD  B Complex-Biotin-FA (B-COMPLEX PO) Take 1 tablet by mouth daily.    Historical Provider, MD  busPIRone (BUSPAR) 15 MG tablet Take 15 mg by mouth 2 (two) times daily.    Historical Provider, MD  carbidopa-levodopa (SINEMET IR) 25-100 MG per tablet Take 1 tablet by mouth 2 (two) times daily.     Historical Provider, MD  cetirizine (ZYRTEC) 10 MG tablet Take 10 mg by mouth daily as needed for allergies.    Historical Provider, MD  doxycycline (VIBRA-TABS) 100 MG tablet Take 1 tablet (100 mg total) by mouth 2 (two) times daily. 12/17/13   Erick Blinks, MD  galantamine (RAZADYNE) 4 MG tablet Take 4 mg by mouth 2 (two) times daily.    Historical Provider, MD  memantine (NAMENDA) 10 MG tablet Take 20 mg by mouth daily.    Historical Provider, MD  mirtazapine (REMERON) 15 MG tablet Take 15 mg by mouth at bedtime.    Historical Provider, MD  Omega-3 Fatty Acids (FISH OIL) 1200 MG CAPS Take 1 capsule by mouth daily.    Historical Provider, MD  pantoprazole (PROTONIX) 40 MG tablet Take 1 tablet (40 mg total) by mouth daily at 12 noon. 12/17/13   Erick Blinks, MD  QUEtiapine (SEROQUEL) 25 MG tablet Take 25 mg by mouth at bedtime.    Historical Provider, MD  selenium sulfide (ANTI-DANDRUFF) 1 % LOTN Apply 1 application topically 3 (three) times a week.    Historical Provider, MD  sertraline (ZOLOFT) 50 MG tablet Take 50 mg by mouth daily.    Historical Provider, MD  vitamin C (ASCORBIC ACID) 500 MG tablet Take 500 mg by mouth daily.    Historical Provider, MD  vitamin E 400 UNIT capsule Take 400 Units by mouth daily.    Historical Provider, MD   BP 139/59 mmHg  Pulse 74  Temp(Src) 97.4 F (36.3 C) (Oral)  Resp 18  SpO2 100%  Vital signs normal   Physical Exam  Constitutional: He appears well-developed and well-nourished.  Non-toxic appearance. He does not appear ill. No distress.  HENT:  Head: Normocephalic and atraumatic.  Right Ear: External ear  normal.  Left Ear: External ear normal.  Nose: Nose normal. No mucosal edema or rhinorrhea.  Mouth/Throat: Oropharynx is clear and moist and mucous membranes are normal. No dental abscesses or uvula swelling.  Eyes: Conjunctivae and EOM are normal. Pupils are equal, round, and reactive to light.  Neck: Normal range of motion and full passive range of motion without pain. Neck supple.  Cardiovascular: Normal rate, regular rhythm and normal heart sounds.  Exam reveals no gallop and no friction rub.   No murmur heard. Pulmonary/Chest: Effort normal and breath sounds normal. No respiratory distress. He has no wheezes. He has no rhonchi. He has no rales. He exhibits no tenderness and no crepitus.  Abdominal: Soft. Normal appearance and bowel sounds are normal. He exhibits no distension. There is no tenderness. There is no rebound and no guarding.  Musculoskeletal: Normal range of motion. He exhibits no edema or tenderness.  Moves all extremities well.   Neurological: He is alert. He has normal strength. No cranial nerve deficit.  Skin: Skin is warm, dry and intact. Rash noted. No erythema. No pallor.  Hands are spared, no lesions between fingers. The lesions are not clustered around his waistline. Patient has scattered red rash that is pretty diffuse on his trunk and extremities. He does not have involvement of his face. Please see photos  Psychiatric: He has a normal mood and affect. His speech is normal and behavior is normal. His mood appears not anxious.  Nursing note and vitals reviewed.           ED Course  Procedures (including critical care time)  Medications  methylPREDNISolone sodium succinate (SOLU-MEDROL) 125 mg/2 mL injection 125 mg (125 mg Intramuscular Given 05/15/14 1556)  diphenhydrAMINE (BENADRYL) injection 50 mg (50 mg Intramuscular Given 05/15/14 1556)  famotidine (PEPCID) tablet 20 mg (20 mg Oral Given 05/15/14 1557)     DIAGNOSTIC STUDIES: Oxygen Saturation is  100% on room air, normal by my interpretation.    COORDINATION OF CARE: 1:50 PM Discussed treatment plan with patient at beside, the patient agrees with the plan and has no further questions at this time.  15:00 wife has arrived. She states that the rash started last week. She states it started as round distinct red lesions sometimes with pus coming from them. He has been scratching at them. She states two other residents at his memory care unit also have rash. She does not know if they've had a change in detergent. She states as far she knows his medications have not been changed.She states normally he is a "walker" and the fact that  he's been in bed in the ED today without attempting to get up is unusual for him.  Labs Review Results for orders placed or performed during the hospital encounter of 05/15/14  Urinalysis, Routine w reflex microscopic  Result Value Ref Range   Color, Urine YELLOW YELLOW   APPearance CLEAR CLEAR   Specific Gravity, Urine 1.020 1.005 - 1.030   pH 6.0 5.0 - 8.0   Glucose, UA NEGATIVE NEGATIVE mg/dL   Hgb urine dipstick NEGATIVE NEGATIVE   Bilirubin Urine NEGATIVE NEGATIVE   Ketones, ur NEGATIVE NEGATIVE mg/dL   Protein, ur NEGATIVE NEGATIVE mg/dL   Urobilinogen, UA 0.2 0.0 - 1.0 mg/dL   Nitrite NEGATIVE NEGATIVE   Leukocytes, UA NEGATIVE NEGATIVE  CBC with Differential  Result Value Ref Range   WBC 7.6 4.0 - 10.5 K/uL   RBC 4.08 (L) 4.22 - 5.81 MIL/uL   Hemoglobin 13.1 13.0 - 17.0 g/dL   HCT 78.2 (L) 95.6 - 21.3 %   MCV 87.5 78.0 - 100.0 fL   MCH 32.1 26.0 - 34.0 pg   MCHC 36.7 (H) 30.0 - 36.0 g/dL   RDW 08.6 57.8 - 46.9 %   Platelets 90 (L) 150 - 400 K/uL   Neutrophils Relative % 76 43 - 77 %   Lymphocytes Relative 14 12 - 46 %   Monocytes Relative 5 3 - 12 %   Eosinophils Relative 5 0 - 5 %   Basophils Relative 0 0 - 1 %   Neutro Abs 5.7 1.7 - 7.7 K/uL   Lymphs Abs 1.1 0.7 - 4.0 K/uL   Monocytes Absolute 0.4 0.1 - 1.0 K/uL   Eosinophils Absolute  0.4 0.0 - 0.7 K/uL   Basophils Absolute 0.0 0.0 - 0.1 K/uL   RBC Morphology ELLIPTOCYTES    WBC Morphology ATYPICAL LYMPHOCYTES    Smear Review PLATELET COUNT CONFIRMED BY SMEAR   Comprehensive metabolic panel  Result Value Ref Range   Sodium 141 137 - 147 mEq/L   Potassium 3.9 3.7 - 5.3 mEq/L   Chloride 103 96 - 112 mEq/L   CO2 29 19 - 32 mEq/L   Glucose, Bld 96 70 - 99 mg/dL   BUN 26 (H) 6 - 23 mg/dL   Creatinine, Ser 6.29 0.50 - 1.35 mg/dL   Calcium 9.3 8.4 - 52.8 mg/dL   Total Protein 6.8 6.0 - 8.3 g/dL   Albumin 3.7 3.5 - 5.2 g/dL   AST 21 0 - 37 U/L   ALT 5 0 - 53 U/L   Alkaline Phosphatase 74 39 - 117 U/L   Total Bilirubin 0.6 0.3 - 1.2 mg/dL   GFR calc non Af Amer 66 (L) >90 mL/min   GFR calc Af Amer 77 (L) >90 mL/min   Anion gap 9 5 - 15   Laboratory interpretation all normal     Imaging Review No results found.   EKG Interpretation None      MDM   Final diagnoses:  Contact dermatitis   New Prescriptions   DIPHENHYDRAMINE (BENADRYL) 25 MG TABLET    Take 2 tablets (50 mg total) by mouth every 4 (four) hours as needed for itching (not relieved by zyrtec).   FAMOTIDINE (PEPCID) 20 MG TABLET    Take 1 tablet (20 mg total) by mouth 2 (two) times daily.   PREDNISONE (DELTASONE) 10 MG TABLET    Prednisone 20 mg  Take 3 po QD starting on November 16  x 3 d then 2 po QD x  3d then 1 po QD x 3d    Plan discharge  Loryn Haacke, MD, FACEP   I personally performed thDevoria Albee services described in this documentation, which was scribed in my presence. The recorded information has been reviewed and considered.  Devoria AlbeIva Domingos Riggi, MD, Armando GangFACEP    Ward GivensIva L Everli Rother, MD 05/15/14 [phone]

## 2014-05-15 NOTE — Discharge Instructions (Signed)
Use baking soda baths to soothe his skin. Continue the zyrtec daily. Use the benadryl for itching not controlled by the zyrtec. Give him the prednisone and pepcid until gone.  If his rash isn't improving, call Dr Okey RegalJohn Hall's office to be seen in his [place] office.  Call your vendor to see if the laundry detergent or soaps have been changed.  This appears to be a contact dermatitis and is mainly in areas covered by clothing. He has no rash on his hands, neck or face.  Recheck if he gets a high fever, he gets sores in his mouth or he seems worse.    Contact Dermatitis Contact dermatitis is a reaction to certain substances that touch the skin. Contact dermatitis can be either irritant contact dermatitis or allergic contact dermatitis. Irritant contact dermatitis does not require previous exposure to the substance for a reaction to occur.Allergic contact dermatitis only occurs if you have been exposed to the substance before. Upon a repeat exposure, your body reacts to the substance.  CAUSES  Many substances can cause contact dermatitis. Irritant dermatitis is most commonly caused by repeated exposure to mildly irritating substances, such as:  Makeup.  Soaps.  Detergents.  Bleaches.  Acids.  Metal salts, such as nickel. Allergic contact dermatitis is most commonly caused by exposure to:  Poisonous plants.  Chemicals (deodorants, shampoos).  Jewelry.  Latex.  Neomycin in triple antibiotic cream.  Preservatives in products, including clothing. SYMPTOMS  The area of skin that is exposed may develop:  Dryness or flaking.  Redness.  Cracks.  Itching.  Pain or a burning sensation.  Blisters. With allergic contact dermatitis, there may also be swelling in areas such as the eyelids, mouth, or genitals.  DIAGNOSIS  Your caregiver can usually tell what the problem is by doing a physical exam. In cases where the cause is uncertain and an allergic contact dermatitis is suspected,  a patch skin test may be performed to help determine the cause of your dermatitis. TREATMENT Treatment includes protecting the skin from further contact with the irritating substance by avoiding that substance if possible. Barrier creams, powders, and gloves may be helpful. Your caregiver may also recommend:  Steroid creams or ointments applied 2 times daily. For best results, soak the rash area in cool water for 20 minutes. Then apply the medicine. Cover the area with a plastic wrap. You can store the steroid cream in the refrigerator for a "chilly" effect on your rash. That may decrease itching. Oral steroid medicines may be needed in more severe cases.  Antibiotics or antibacterial ointments if a skin infection is present.  Antihistamine lotion or an antihistamine taken by mouth to ease itching.  Lubricants to keep moisture in your skin.  Burow's solution to reduce redness and soreness or to dry a weeping rash. Mix one packet or tablet of solution in 2 cups cool water. Dip a clean washcloth in the mixture, wring it out a bit, and put it on the affected area. Leave the cloth in place for 30 minutes. Do this as often as possible throughout the day.  Taking several cornstarch or baking soda baths daily if the area is too large to cover with a washcloth. Harsh chemicals, such as alkalis or acids, can cause skin damage that is like a burn. You should flush your skin for 15 to 20 minutes with cold water after such an exposure. You should also seek immediate medical care after exposure. Bandages (dressings), antibiotics, and pain medicine may be  needed for severely irritated skin.  HOME CARE INSTRUCTIONS  Avoid the substance that caused your reaction.  Keep the area of skin that is affected away from hot water, soap, sunlight, chemicals, acidic substances, or anything else that would irritate your skin.  Do not scratch the rash. Scratching may cause the rash to become infected.  You may take cool  baths to help stop the itching.  Only take over-the-counter or prescription medicines as directed by your caregiver.  See your caregiver for follow-up care as directed to make sure your skin is healing properly. SEEK MEDICAL CARE IF:   Your condition is not better after 3 days of treatment.  You seem to be getting worse.  You see signs of infection such as swelling, tenderness, redness, soreness, or warmth in the affected area.  You have any problems related to your medicines. Document Released: 06/13/2000 Document Revised: 09/08/2011 Document Reviewed: 11/19/2010 [facility] Patient Information 2015 [employer] [place]. This information is not intended to replace advice given to you by your health care provider. Make sure you discuss any questions you have with your health care provider.

## 2014-05-15 NOTE — ED Notes (Signed)
Pt from alzheimer's unit, placed in view of nursing station. Pt only responds with yes to any answer.

## 2014-05-15 NOTE — ED Notes (Signed)
Pt alert & oriented x4, stable gait. Patient given discharge instructions, paperwork & prescription(s). Patient instructed to stop at the registration desk to finish any additional paperwork. Patient verbalized understanding. Pt left department w/ no further questions. Pt's wife transporting pt back to nursing home.

## 2014-05-15 NOTE — ED Notes (Signed)
[facility] pt has had rash for at least 14 days, was started on a cream for rash at that time, no improvement so medication was refilled five days later. Nurse states that the rash is worse since Friday on sides and back and states the pt seems more lethargic at times. Unable to wait until the MD comes on Thursday, sent to ER for evaluation.

## 2014-05-16 ENCOUNTER — Telehealth (HOSPITAL_BASED_OUTPATIENT_CLINIC_OR_DEPARTMENT_OTHER): Payer: Self-pay | Admitting: Emergency Medicine

## 2014-05-29 ENCOUNTER — Encounter (HOSPITAL_COMMUNITY): Payer: Self-pay

## 2014-05-29 ENCOUNTER — Emergency Department (HOSPITAL_COMMUNITY)
Admission: EM | Admit: 2014-05-29 | Discharge: 2014-05-29 | Disposition: A | Discharge status: Skilled Nursing Facility | Payer: Medicare Other | Attending: Emergency Medicine | Admitting: Emergency Medicine

## 2014-05-29 DIAGNOSIS — Z87448 Personal history of other diseases of urinary system: Secondary | ICD-10-CM | POA: Diagnosis not present

## 2014-05-29 DIAGNOSIS — F0280 Dementia in other diseases classified elsewhere without behavioral disturbance: Secondary | ICD-10-CM | POA: Insufficient documentation

## 2014-05-29 DIAGNOSIS — Z79899 Other long term (current) drug therapy: Secondary | ICD-10-CM | POA: Insufficient documentation

## 2014-05-29 DIAGNOSIS — Z7952 Long term (current) use of systemic steroids: Secondary | ICD-10-CM | POA: Diagnosis not present

## 2014-05-29 DIAGNOSIS — Z862 Personal history of diseases of the blood and blood-forming organs and certain disorders involving the immune mechanism: Secondary | ICD-10-CM | POA: Insufficient documentation

## 2014-05-29 DIAGNOSIS — Z7982 Long term (current) use of aspirin: Secondary | ICD-10-CM | POA: Insufficient documentation

## 2014-05-29 DIAGNOSIS — I10 Essential (primary) hypertension: Secondary | ICD-10-CM | POA: Insufficient documentation

## 2014-05-29 DIAGNOSIS — R21 Rash and other nonspecific skin eruption: Secondary | ICD-10-CM | POA: Diagnosis not present

## 2014-05-29 DIAGNOSIS — Z8639 Personal history of other endocrine, nutritional and metabolic disease: Secondary | ICD-10-CM | POA: Insufficient documentation

## 2014-05-29 DIAGNOSIS — G309 Alzheimer's disease, unspecified: Secondary | ICD-10-CM | POA: Insufficient documentation

## 2014-05-29 DIAGNOSIS — G20 Parkinson's disease: Secondary | ICD-10-CM | POA: Diagnosis not present

## 2014-05-29 MED ORDER — CEPHALEXIN 500 MG PO CAPS
500.0000 mg | ORAL_CAPSULE | Freq: Once | ORAL | Status: AC
  Administered 2014-05-29: 500 mg via ORAL
  Filled 2014-05-29: qty 1

## 2014-05-29 MED ORDER — CETAPHIL MOISTURIZING EX CREA: TOPICAL_CREAM | CUTANEOUS | Status: AC | PRN

## 2014-05-29 MED ORDER — HYDROGEN PEROXIDE 3 % EX SOLN
CUTANEOUS | Status: AC
  Filled 2014-05-29: qty 473

## 2014-05-29 MED ORDER — CEPHALEXIN 500 MG PO CAPS: 500.0000 mg | ORAL_CAPSULE | Freq: Two times a day (BID) | ORAL | Status: DC

## 2014-05-29 NOTE — ED Notes (Signed)
Patient becoming anxious; patient walked around nursing station a few times with family and then given an activity belt

## 2014-05-29 NOTE — ED Provider Notes (Signed)
CSN: [id number]     Arrival date & time 05/29/14  1214 History  This chart was scribed for Gerhard Munchobert Donevin Sainsbury, MD by Ronney LionSuzanne Le, ED Scribe. This patient was seen in room APAH6/APAH6 and the patient's care was started at 3:27 PM.    Chief Complaint  Patient presents with  . Rash   The history is provided by a relative (wife). The history is limited by the condition of the patient. No language interpreter was used.    Level V caveat HPI Comments: Evan Lozano is a 68 y.o. male who presents to the Emergency Department complaining of itchy rashes on his abdomen, back, and extremities that began 2 weeks ago, per wife. Per wife, he was evaluated a week ago and was started on medication, but not antibiotics. Wife says that he may have had sick contact with 2 other people who have similar symptoms. Wife denies that the patient experiences any pain, fever, and syncope. Per wife, patient sees Evan Lozano, a dermatologist.  She has not seen the dermatologist for follow-up, due to social issues.   Past Medical History  Diagnosis Date  . Alzheimer disease   . Hypertension   . Dementia   . BPH (benign prostatic hyperplasia)   . HLD (hyperlipidemia)   . Normocytic anemia   . Parkinson disease    Past Surgical History  Procedure Laterality Date  . Rectal surgery     Family History  Problem Relation Age of Onset  . Alzheimer's disease Father    History  Substance Use Topics  . Smoking status: Never Smoker   . Smokeless tobacco: Not on file  . Alcohol Use: No    Review of Systems  Constitutional: Negative for fever.       Per HPI, otherwise negative  HENT:       Per HPI, otherwise negative  Respiratory:       Per HPI, otherwise negative  Cardiovascular:       Per HPI, otherwise negative  Endocrine:       Negative aside from HPI  Genitourinary:       Neg aside from HPI   Musculoskeletal:       Per HPI, otherwise negative  Skin: Positive for rash.  Neurological: Negative for syncope.   All other systems reviewed and are negative.  Level V caveat for review of systems, secondary to dementia, but some details provided by the wife.   Allergies  Review of patient's allergies indicates no known allergies.  Home Medications   Prior to Admission medications   Medication Sig Start Date End Date Taking? Authorizing Provider  acidophilus (RISAQUAD) CAPS capsule Take 1 capsule by mouth daily.    Historical Provider, MD  aspirin 81 MG chewable tablet Chew 81 mg by mouth daily.    Historical Provider, MD  B Complex-Biotin-FA (B-COMPLEX PO) Take 1 tablet by mouth daily.    Historical Provider, MD  busPIRone (BUSPAR) 15 MG tablet Take 15 mg by mouth 2 (two) times daily.    Historical Provider, MD  carbidopa-levodopa (SINEMET IR) 25-100 MG per tablet Take 1 tablet by mouth 2 (two) times daily.     Historical Provider, MD  cetirizine (ZYRTEC) 10 MG tablet Take 10 mg by mouth daily as needed for allergies.    Historical Provider, MD  diphenhydrAMINE (BENADRYL) 25 MG tablet Take 2 tablets (50 mg total) by mouth every 4 (four) hours as needed for itching (not relieved by zyrtec). 05/15/14   Ward GivensIva L Knapp, MD  doxycycline (VIBRA-TABS) 100 MG tablet Take 1 tablet (100 mg total) by mouth 2 (two) times daily. Patient not taking: Reported on 05/15/2014 12/17/13   Erick Blinks, MD  famotidine (PEPCID) 20 MG tablet Take 1 tablet (20 mg total) by mouth 2 (two) times daily. 05/15/14   Ward Givens, MD  galantamine (RAZADYNE) 4 MG tablet Take 4 mg by mouth 2 (two) times daily.    Historical Provider, MD  Infant Care Products (DERMACLOUD) CREA Apply 1 application topically 3 (three) times daily as needed (rash).    Historical Provider, MD  mirtazapine (REMERON) 15 MG tablet Take 15 mg by mouth at bedtime.    Historical Provider, MD  Omega-3 Fatty Acids (FISH OIL) 1200 MG CAPS Take 1 capsule by mouth daily.    Historical Provider, MD  pantoprazole (PROTONIX) 40 MG tablet Take 1 tablet (40 mg total) by  mouth daily at 12 noon. Patient not taking: Reported on 05/15/2014 12/17/13   Erick Blinks, MD  prazosin (MINIPRESS) 2 MG capsule Take 2 mg by mouth 2 (two) times daily.    Historical Provider, MD  predniSONE (DELTASONE) 10 MG tablet Prednisone 20 mg  Take 3 po QD starting on November 16  x 3 d then 2 po QD x 3d then 1 po QD x 3d 05/15/14   Ward Givens, MD  QUEtiapine (SEROQUEL) 25 MG tablet Take 25 mg by mouth at bedtime.    Historical Provider, MD  selenium sulfide (ANTI-DANDRUFF) 1 % LOTN Apply 1 application topically 3 (three) times a week.    Historical Provider, MD  selenium sulfide (SELSUN) 1 % LOTN Apply 1 application topically 3 (three) times a week.    Historical Provider, MD  sertraline (ZOLOFT) 50 MG tablet Take 50 mg by mouth daily.    Historical Provider, MD  triamcinolone cream (KENALOG) 0.1 % Apply 1 application topically 2 (two) times daily. Starting 05/13/2014 x 7 days.    Historical Provider, MD  vitamin C (ASCORBIC ACID) 500 MG tablet Take 500 mg by mouth daily.    Historical Provider, MD  Vitamin D, Ergocalciferol, (DRISDOL) 50000 UNITS CAPS capsule Take 50,000 Units by mouth every 7 (seven) days. Takes on Saturdays.    Historical Provider, MD  vitamin E 400 UNIT capsule Take 400 Units by mouth daily.    Historical Provider, MD   BP 127/71 mmHg  Pulse 77  Temp(Src) 97.6 F (36.4 C) (Oral)  Resp 16  SpO2 100% Physical Exam  Constitutional: He appears well-developed. No distress.  HENT:  Head: Normocephalic and atraumatic.  Eyes: Conjunctivae and EOM are normal.  Cardiovascular: Normal rate and regular rhythm.   Pulmonary/Chest: Effort normal. No stridor. No respiratory distress.  Abdominal: He exhibits no distension.  Musculoskeletal: He exhibits no edema.  Neurological: He is alert.  Patient does not speak, he does move all extremities SPONTANEOUSLY, HAS NO GROSS FACIAL ASYMMETRY AND GAIT IS INTACT.  Skin: Skin is warm and dry.  Diffuse, erythematous,  non-confluent, dry, rash with no areas of bleeding or discharge on left arm. Pruritic, dry, possible dermatitis/folliculitis on abdomen.  Psychiatric: He is slowed and withdrawn. Cognition and memory are impaired. He is noncommunicative.  Nursing note and vitals reviewed.   ED Course  Procedures (including critical care time)  DIAGNOSTIC STUDIES: Oxygen Saturation is 100% on room air, normal by my interpretation.    COORDINATION OF CARE: 3:33 PM - Discussed treatment plan with pt at bedside which includes anti-itch cream and ABX and pt agreed  to plan.   I reviewed patient's I reviewed the patient's chart from last week. Today's rashes similar in appearance to that presentation.  MDM    I personally performed the services described in this documentation, which was scribed in my presence. The recorded information has been reviewed and is accurate.    I personally performed the services described in this documentation, which was scribed in my presence. The recorded information has been reviewed and is accurate.   This patient with dementia presents with ongoing rash.  No evidence for sepsis, bacteremia, SJS, or TEN.  Patient is otherwise generally well appearing. I had a lengthy conversation with the wife about the patient's rash, including likely etiology; dermatitis. He was d/c w ABX, cream, continued anti-pruritis meds and derm F/U.   Gerhard Munchobert Giavanni Odonovan, MD 05/29/14 [phone]

## 2014-05-29 NOTE — ED Notes (Signed)
[facility]

## 2014-05-29 NOTE — Discharge Instructions (Signed)
As discussed, your husband's rash is likely due to skin inflammation and possibly infection, either dermatitis or folliculitis.  Please monitor his condition carefully, use the prescribed estrogen cream, as well as the antibiotics regularly.  Return here for concerning changes in his condition, otherwise be sure to follow-up with your dermatologist.

## 2014-05-29 NOTE — ED Notes (Signed)
Wife here now. States she knows her husband needs to go to a dermatologist but she can't handle him in a doctors office because he will not be still

## 2014-05-31 ENCOUNTER — Emergency Department (HOSPITAL_COMMUNITY)
Admission: EM | Admit: 2014-05-31 | Discharge: 2014-05-31 | Disposition: A | Discharge status: Home / Self Care | Payer: Medicare Other | Attending: Emergency Medicine | Admitting: Emergency Medicine

## 2014-05-31 ENCOUNTER — Encounter (HOSPITAL_COMMUNITY): Payer: Self-pay | Admitting: Emergency Medicine

## 2014-05-31 DIAGNOSIS — G309 Alzheimer's disease, unspecified: Secondary | ICD-10-CM | POA: Diagnosis not present

## 2014-05-31 DIAGNOSIS — E785 Hyperlipidemia, unspecified: Secondary | ICD-10-CM | POA: Diagnosis not present

## 2014-05-31 DIAGNOSIS — Z862 Personal history of diseases of the blood and blood-forming organs and certain disorders involving the immune mechanism: Secondary | ICD-10-CM | POA: Insufficient documentation

## 2014-05-31 DIAGNOSIS — I10 Essential (primary) hypertension: Secondary | ICD-10-CM | POA: Diagnosis not present

## 2014-05-31 DIAGNOSIS — Z79899 Other long term (current) drug therapy: Secondary | ICD-10-CM | POA: Insufficient documentation

## 2014-05-31 DIAGNOSIS — Z792 Long term (current) use of antibiotics: Secondary | ICD-10-CM | POA: Diagnosis not present

## 2014-05-31 DIAGNOSIS — G3183 Dementia with Lewy bodies: Secondary | ICD-10-CM | POA: Insufficient documentation

## 2014-05-31 DIAGNOSIS — Z87448 Personal history of other diseases of urinary system: Secondary | ICD-10-CM | POA: Diagnosis not present

## 2014-05-31 DIAGNOSIS — L02213 Cutaneous abscess of chest wall: Secondary | ICD-10-CM | POA: Insufficient documentation

## 2014-05-31 DIAGNOSIS — Z7982 Long term (current) use of aspirin: Secondary | ICD-10-CM | POA: Diagnosis not present

## 2014-05-31 DIAGNOSIS — L0291 Cutaneous abscess, unspecified: Secondary | ICD-10-CM

## 2014-05-31 DIAGNOSIS — F0280 Dementia in other diseases classified elsewhere without behavioral disturbance: Secondary | ICD-10-CM | POA: Insufficient documentation

## 2014-05-31 MED ORDER — LIDOCAINE HCL (PF) 1 % IJ SOLN
INTRAMUSCULAR | Status: AC
  Administered 2014-05-31: 19:00:00
  Filled 2014-05-31: qty 5

## 2014-05-31 MED ORDER — POVIDONE-IODINE 10 % EX SOLN
CUTANEOUS | Status: AC
  Filled 2014-05-31: qty 118

## 2014-05-31 NOTE — ED Provider Notes (Signed)
CSN: [id number]     Arrival date & time 05/31/14  1804 History   First MD Initiated Contact with Patient 05/31/14 1810     Chief Complaint  Patient presents with  . Abscess     (Consider location/radiation/quality/duration/timing/severity/associated sxs/prior Treatment) HPI Patient presents from his nursing facility. Patient was here 2 days ago.  Patient is demented, level V caveat. Patient's wife is here, she assists with the history of present illness. According to the wife, the patient's rash, which was seen here last week, and again 2 days ago, has generally improved substantially, and there is no new fever, systemic complaints, and the patient is generally getting better. However, over the past day, one of the previously small lesions just in the left anterior-inferior chest wall has become larger, with purulent drainage.  Past Medical History  Diagnosis Date  . Alzheimer disease   . Hypertension   . Dementia   . BPH (benign prostatic hyperplasia)   . HLD (hyperlipidemia)   . Normocytic anemia   . Parkinson disease    Past Surgical History  Procedure Laterality Date  . Rectal surgery     Family History  Problem Relation Age of Onset  . Alzheimer's disease Father    History  Substance Use Topics  . Smoking status: Never Smoker   . Smokeless tobacco: Not on file  . Alcohol Use: No    Review of Systems  Unable to perform ROS: Dementia      Allergies  Review of patient's allergies indicates no known allergies.  Home Medications   Prior to Admission medications   Medication Sig Start Date End Date Taking? Authorizing Provider  acidophilus (RISAQUAD) CAPS capsule Take 1 capsule by mouth daily.    Historical Provider, MD  aspirin 81 MG chewable tablet Chew 81 mg by mouth daily.    Historical Provider, MD  B Complex-Biotin-FA (B-COMPLEX PO) Take 1 tablet by mouth daily.    Historical Provider, MD  busPIRone (BUSPAR) 15 MG tablet Take 15 mg by mouth 2 (two) times  daily.    Historical Provider, MD  carbidopa-levodopa (SINEMET IR) 25-100 MG per tablet Take 1 tablet by mouth 2 (two) times daily.     Historical Provider, MD  cephALEXin (KEFLEX) 500 MG capsule Take 1 capsule (500 mg total) by mouth 2 (two) times daily. 05/29/14   Gerhard Munchobert Davion Flannery, MD  cetirizine (ZYRTEC) 10 MG tablet Take 10 mg by mouth daily as needed for allergies.    Historical Provider, MD  diphenhydrAMINE (BENADRYL) 25 MG tablet Take 2 tablets (50 mg total) by mouth every 4 (four) hours as needed for itching (not relieved by zyrtec). 05/15/14   Ward GivensIva L Knapp, MD  Emollient (CETAPHIL) cream Apply topically as needed. 05/29/14   Gerhard Munchobert Albin Duckett, MD  famotidine (PEPCID) 20 MG tablet Take 1 tablet (20 mg total) by mouth 2 (two) times daily. 05/15/14   Ward GivensIva L Knapp, MD  galantamine (RAZADYNE) 4 MG tablet Take 4 mg by mouth 2 (two) times daily.    Historical Provider, MD  Infant Care Products (DERMACLOUD) CREA Apply 1 application topically 3 (three) times daily as needed (rash).    Historical Provider, MD  mirtazapine (REMERON) 15 MG tablet Take 15 mg by mouth at bedtime.    Historical Provider, MD  Omega-3 Fatty Acids (FISH OIL) 1200 MG CAPS Take 1 capsule by mouth daily.    Historical Provider, MD  pantoprazole (PROTONIX) 40 MG tablet Take 1 tablet (40 mg total) by mouth daily at  12 noon. Patient not taking: Reported on 05/15/2014 12/17/13   Erick Blinks, MD  prazosin (MINIPRESS) 2 MG capsule Take 2 mg by mouth 2 (two) times daily.    Historical Provider, MD  QUEtiapine (SEROQUEL) 25 MG tablet Take 25 mg by mouth at bedtime.    Historical Provider, MD  selenium sulfide (ANTI-DANDRUFF) 1 % LOTN Apply 1 application topically 3 (three) times a week.    Historical Provider, MD  selenium sulfide (SELSUN) 1 % LOTN Apply 1 application topically 3 (three) times a week.    Historical Provider, MD  sertraline (ZOLOFT) 50 MG tablet Take 50 mg by mouth daily.    Historical Provider, MD  vitamin C (ASCORBIC  ACID) 500 MG tablet Take 500 mg by mouth daily.    Historical Provider, MD  Vitamin D, Ergocalciferol, (DRISDOL) 50000 UNITS CAPS capsule Take 50,000 Units by mouth every 7 (seven) days. Takes on Saturdays.    Historical Provider, MD  vitamin E 400 UNIT capsule Take 400 Units by mouth daily.    Historical Provider, MD   BP 112/84 mmHg  Pulse 74  Temp(Src) 98 F (36.7 C)  Resp 16  Wt 117 lb (53.071 kg)  SpO2 100% Physical Exam  Constitutional: He appears well-developed. No distress.  HENT:  Head: Normocephalic and atraumatic.  Eyes: Conjunctivae and EOM are normal.  Cardiovascular: Normal rate and regular rhythm.   Pulmonary/Chest: Effort normal. No stridor. No respiratory distress.  Abdominal: He exhibits no distension.  Musculoskeletal: He exhibits no edema.  Neurological: He is alert.  Patient does not speak, he does move all extremities SPONTANEOUSLY, HAS NO GROSS FACIAL ASYMMETRY AND GAIT IS INTACT.  Skin: Skin is warm and dry.  Diffuse, erythematous, non-confluent, dry, rash with no areas of bleeding or discharge on left arm. Compared to 2 days ago, there are substantially less lesions, and all of the lesions look generally better, with no areas of fluctuance. However, on the left inferior anterior breast, there is a 4 cm indurated area with a focal area of active minimal drainage, and fluctuance.  Psychiatric: He is slowed and withdrawn. Cognition and memory are impaired. He is noncommunicative.  Nursing note and vitals reviewed.   ED Course  INCISION AND DRAINAGE Date/Time: 05/31/2014 6:42 PM Performed by: Gerhard Munch Authorized by: Gerhard Munch Consent: The procedure was performed in an emergent situation. Verbal consent obtained. Risks and benefits: risks, benefits and alternatives were discussed Consent given by: spouse Patient understanding: patient states understanding of the procedure being performed Patient consent: the patient's understanding of the  procedure matches consent given Procedure consent: procedure consent matches procedure scheduled Relevant documents: relevant documents present and verified Test results: test results available and properly labeled Site marked: the operative site was marked Imaging studies: imaging studies available Required items: required blood products, implants, devices, and special equipment available Patient identity confirmed: provided demographic data and arm band Time out: Immediately prior to procedure a "time out" was called to verify the correct patient, procedure, equipment, support staff and site/side marked as required. Type: abscess Body area: trunk Location details: left breast Anesthesia: local infiltration Local anesthetic: lidocaine 1% without epinephrine Anesthetic total: 4 ml Patient sedated: no Scalpel size: 10 Incision type: single straight Complexity: simple Drainage: purulent Drainage amount: moderate Wound treatment: wound left open Patient tolerance: Patient tolerated the procedure well with no immediate complications    MDM   Final diagnoses:  Abscess    Patient presents several days after initiating anabiotic for folliculitis, now  with generally improving condition, but with one focal abscess. Patient had drainage of the abscess, performed without complication. Given the absence of fever, distress, and is overall improving condition, patient was discharged back to his nursing facility.    Gerhard Munchobert Mickael Mcnutt, MD 05/31/14 [phone]

## 2014-05-31 NOTE — ED Notes (Signed)
Cleaned open wound with betadine and covered with sterile gauze and paper tape. Patient tolerated well.

## 2014-05-31 NOTE — ED Notes (Addendum)
[facility] with complaint of abscess under left breast that is draining. States it was first noticed today.

## 2014-05-31 NOTE — Discharge Instructions (Signed)
As discussed, it is important to monitor the wound carefully, and do not hesitate to return for concerning changes in his condition.  Please be sure to have the wound checked in 2 days to ensure appropriate resolution.   Abscess An abscess (boil or furuncle) is an infected area on or under the skin. This area is filled with yellowish-white fluid (pus) and other material (debris). HOME CARE   Only take medicines as told by your doctor.  If you were given antibiotic medicine, take it as directed. Finish the medicine even if you start to feel better.  If gauze is used, follow your doctor's directions for changing the gauze.  To avoid spreading the infection:  Keep your abscess covered with a bandage.  Wash your hands well.  Do not share personal care items, towels, or whirlpools with others.  Avoid skin contact with others.  Keep your skin and clothes clean around the abscess.  Keep all doctor visits as told. GET HELP RIGHT AWAY IF:   You have more pain, puffiness (swelling), or redness in the wound site.  You have more fluid or blood coming from the wound site.  You have muscle aches, chills, or you feel sick.  You have a fever. MAKE SURE YOU:   Understand these instructions.  Will watch your condition.  Will get help right away if you are not doing well or get worse. Document Released: 12/03/2007 Document Revised: 12/16/2011 Document Reviewed: 08/29/2011 [facility] Patient Information 2015 [employer] [place]. This information is not intended to replace advice given to you by your health care provider. Make sure you discuss any questions you have with your health care provider.

## 2014-06-29 ENCOUNTER — Inpatient Hospital Stay (HOSPITAL_COMMUNITY)
Admission: EM | Admit: 2014-06-29 | Discharge: 2014-06-30 | DRG: 390 | Disposition: A | Discharge status: Skilled Nursing Facility | Payer: Medicare Other | Attending: Internal Medicine | Admitting: Internal Medicine

## 2014-06-29 ENCOUNTER — Encounter (HOSPITAL_COMMUNITY): Payer: Self-pay | Admitting: Cardiology

## 2014-06-29 ENCOUNTER — Emergency Department (HOSPITAL_COMMUNITY): Payer: Medicare Other

## 2014-06-29 DIAGNOSIS — D696 Thrombocytopenia, unspecified: Secondary | ICD-10-CM | POA: Diagnosis present

## 2014-06-29 DIAGNOSIS — I10 Essential (primary) hypertension: Secondary | ICD-10-CM | POA: Diagnosis present

## 2014-06-29 DIAGNOSIS — K5669 Other intestinal obstruction: Secondary | ICD-10-CM | POA: Diagnosis not present

## 2014-06-29 DIAGNOSIS — F0280 Dementia in other diseases classified elsewhere without behavioral disturbance: Secondary | ICD-10-CM | POA: Diagnosis present

## 2014-06-29 DIAGNOSIS — D649 Anemia, unspecified: Secondary | ICD-10-CM | POA: Diagnosis present

## 2014-06-29 DIAGNOSIS — R197 Diarrhea, unspecified: Secondary | ICD-10-CM

## 2014-06-29 DIAGNOSIS — G20 Parkinson's disease: Secondary | ICD-10-CM | POA: Diagnosis present

## 2014-06-29 DIAGNOSIS — Z66 Do not resuscitate: Secondary | ICD-10-CM | POA: Diagnosis present

## 2014-06-29 DIAGNOSIS — K565 Intestinal adhesions [bands] with obstruction (postprocedural) (postinfection): Principal | ICD-10-CM | POA: Diagnosis present

## 2014-06-29 DIAGNOSIS — G309 Alzheimer's disease, unspecified: Secondary | ICD-10-CM | POA: Diagnosis present

## 2014-06-29 DIAGNOSIS — R1114 Bilious vomiting: Secondary | ICD-10-CM

## 2014-06-29 DIAGNOSIS — Z7982 Long term (current) use of aspirin: Secondary | ICD-10-CM

## 2014-06-29 DIAGNOSIS — N400 Enlarged prostate without lower urinary tract symptoms: Secondary | ICD-10-CM | POA: Diagnosis present

## 2014-06-29 DIAGNOSIS — R1110 Vomiting, unspecified: Secondary | ICD-10-CM | POA: Diagnosis not present

## 2014-06-29 DIAGNOSIS — N210 Calculus in bladder: Secondary | ICD-10-CM | POA: Diagnosis present

## 2014-06-29 DIAGNOSIS — E785 Hyperlipidemia, unspecified: Secondary | ICD-10-CM | POA: Diagnosis present

## 2014-06-29 DIAGNOSIS — K56609 Unspecified intestinal obstruction, unspecified as to partial versus complete obstruction: Secondary | ICD-10-CM

## 2014-06-29 HISTORY — DX: Calculus in bladder: N21.0

## 2014-06-29 LAB — COMPREHENSIVE METABOLIC PANEL
ALBUMIN: 4 g/dL (ref 3.5–5.2)
ALT: 23 U/L (ref 0–53)
ANION GAP: 4 — AB (ref 5–15)
AST: 26 U/L (ref 0–37)
Alkaline Phosphatase: 59 U/L (ref 39–117)
BILIRUBIN TOTAL: 1 mg/dL (ref 0.3–1.2)
BUN: 38 mg/dL — AB (ref 6–23)
CALCIUM: 8.8 mg/dL (ref 8.4–10.5)
CHLORIDE: 107 meq/L (ref 96–112)
CO2: 27 mmol/L (ref 19–32)
CREATININE: 0.98 mg/dL (ref 0.50–1.35)
GFR calc Af Amer: >90 mL/min (ref >90)
GFR calc non Af Amer: 83 mL/min — ABNORMAL LOW (ref >90)
GLUCOSE: 104 mg/dL — AB (ref 70–99)
Potassium: 3.8 mmol/L (ref 3.5–5.1)
Sodium: 138 mmol/L (ref 135–145)
Total Protein: 6.6 g/dL (ref 6.0–8.3)

## 2014-06-29 LAB — CBC WITH DIFFERENTIAL/PLATELET
BASOS ABS: 0 10*3/uL (ref 0.0–0.1)
Basophils Relative: 0 % (ref 0–1)
EOS ABS: 0.1 10*3/uL (ref 0.0–0.7)
Eosinophils Relative: 1 % (ref 0–5)
HCT: 38.7 % — ABNORMAL LOW (ref 39.0–52.0)
HEMOGLOBIN: 13.7 g/dL (ref 13.0–17.0)
LYMPHS ABS: 0.2 10*3/uL — AB (ref 0.7–4.0)
Lymphocytes Relative: 2 % — ABNORMAL LOW (ref 12–46)
MCH: 31.9 pg (ref 26.0–34.0)
MCHC: 35.4 g/dL (ref 30.0–36.0)
MCV: 90 fL (ref 78.0–100.0)
Monocytes Absolute: 0.3 10*3/uL (ref 0.1–1.0)
Monocytes Relative: 3 % (ref 3–12)
Neutro Abs: 9.4 10*3/uL — ABNORMAL HIGH (ref 1.7–7.7)
Neutrophils Relative %: 93 % — ABNORMAL HIGH (ref 43–77)
PLATELETS: 75 10*3/uL — AB (ref 150–400)
RBC: 4.3 MIL/uL (ref 4.22–5.81)
RDW: 15.2 % (ref 11.5–15.5)
Smear Review: DECREASED
WBC: 10.1 10*3/uL (ref 4.0–10.5)

## 2014-06-29 LAB — URINALYSIS, ROUTINE W REFLEX MICROSCOPIC
Bilirubin Urine: NEGATIVE
GLUCOSE, UA: NEGATIVE mg/dL
Hgb urine dipstick: NEGATIVE
LEUKOCYTES UA: NEGATIVE
Nitrite: NEGATIVE
Protein, ur: NEGATIVE mg/dL
Specific Gravity, Urine: >1.03 — ABNORMAL HIGH (ref 1.005–1.030)
UROBILINOGEN UA: 0.2 mg/dL (ref 0.0–1.0)
pH: 5.5 (ref 5.0–8.0)

## 2014-06-29 LAB — I-STAT CG4 LACTIC ACID, ED: LACTIC ACID, VENOUS: 1.06 mmol/L (ref 0.5–2.2)

## 2014-06-29 LAB — LIPASE, BLOOD: Lipase: 37 U/L (ref 11–59)

## 2014-06-29 MED ORDER — ONDANSETRON HCL 4 MG/2ML IJ SOLN
4.0000 mg | Freq: Once | INTRAMUSCULAR | Status: DC
  Filled 2014-06-29: qty 2

## 2014-06-29 MED ORDER — ACETAMINOPHEN 650 MG RE SUPP: 650.0000 mg | Freq: Four times a day (QID) | RECTAL | Status: DC | PRN

## 2014-06-29 MED ORDER — IOHEXOL 300 MG/ML  SOLN
25.0000 mL | Freq: Once | INTRAMUSCULAR | Status: AC | PRN
  Administered 2014-06-29: 25 mL via ORAL

## 2014-06-29 MED ORDER — ONDANSETRON HCL 4 MG/2ML IJ SOLN
4.0000 mg | Freq: Once | INTRAMUSCULAR | Status: AC
  Administered 2014-06-29: 4 mg via INTRAVENOUS

## 2014-06-29 MED ORDER — IOHEXOL 300 MG/ML  SOLN
100.0000 mL | Freq: Once | INTRAMUSCULAR | Status: AC | PRN
  Administered 2014-06-29: 100 mL via INTRAVENOUS

## 2014-06-29 MED ORDER — SODIUM CHLORIDE 0.9 % IV SOLN
Freq: Once | INTRAVENOUS | Status: AC
  Administered 2014-06-29: 12:00:00 via INTRAVENOUS

## 2014-06-29 MED ORDER — ACETAMINOPHEN 325 MG PO TABS: 650.0000 mg | ORAL_TABLET | Freq: Four times a day (QID) | ORAL | Status: DC | PRN

## 2014-06-29 MED ORDER — ONDANSETRON HCL 4 MG PO TABS: 4.0000 mg | ORAL_TABLET | Freq: Four times a day (QID) | ORAL | Status: DC | PRN

## 2014-06-29 MED ORDER — ONDANSETRON HCL 4 MG/2ML IJ SOLN: 4.0000 mg | Freq: Four times a day (QID) | INTRAMUSCULAR | Status: DC | PRN

## 2014-06-29 MED ORDER — LORAZEPAM 2 MG/ML IJ SOLN
0.5000 mg | INTRAMUSCULAR | Status: DC | PRN
  Administered 2014-06-29 – 2014-06-30 (×2): 0.5 mg via INTRAVENOUS
  Filled 2014-06-29 (×2): qty 1

## 2014-06-29 MED ORDER — SODIUM CHLORIDE 0.9 % IV BOLUS (SEPSIS)
1000.0000 mL | Freq: Once | INTRAVENOUS | Status: AC
  Administered 2014-06-29: 1000 mL via INTRAVENOUS

## 2014-06-29 MED ORDER — SODIUM CHLORIDE 0.9 % IV SOLN: INTRAVENOUS | Status: DC

## 2014-06-29 MED ORDER — SODIUM CHLORIDE 0.9 % IV SOLN
INTRAVENOUS | Status: DC
  Administered 2014-06-29: 800 mL via INTRAVENOUS

## 2014-06-29 NOTE — ED Provider Notes (Signed)
CSN: [id number]     Arrival date & time 06/29/14  [id number] History   First MD Initiated Contact with Patient 06/29/14 0845     Chief Complaint  Patient presents with  . Emesis  . Diarrhea     (Consider location/radiation/quality/duration/timing/severity/associated sxs/prior Treatment) The history is provided by the spouse and the nursing home. The history is limited by the condition of the patient.   Level 5 caveat due to dementia   Waylan BogaWilliam Johal is a 68 y.o. male with a history significant for alzheimers disease and Parkinsons presenting from his nursing home with vomiting, diarrhea and a low grade fever which started this morning.  Wife is present at bedside but is not aware of the frequency of symptoms, just that they started this am.  Patient is unable to give details regarding symptoms.  He does answer yes and no questions, but accuracy is unclear.  Past Medical History  Diagnosis Date  . Alzheimer disease   . Hypertension   . Dementia   . BPH (benign prostatic hyperplasia)   . HLD (hyperlipidemia)   . Normocytic anemia   . Parkinson disease    Past Surgical History  Procedure Laterality Date  . Rectal surgery     Family History  Problem Relation Age of Onset  . Alzheimer's disease Father    History  Substance Use Topics  . Smoking status: Never Smoker   . Smokeless tobacco: Not on file  . Alcohol Use: No    Review of Systems  Unable to perform ROS Gastrointestinal: Positive for vomiting and diarrhea.      Allergies  Review of patient's allergies indicates no known allergies.  Home Medications   Prior to Admission medications   Medication Sig Start Date End Date Taking? Authorizing Provider  acetaminophen (TYLENOL) 500 MG tablet Take 500 mg by mouth every 4 (four) hours as needed. For fever 99.5 to 101. Monitor temperature 3 times daily until normal above 101.   Yes Historical Provider, MD  acidophilus (RISAQUAD) CAPS capsule Take 1 capsule by mouth daily.    Yes Historical Provider, MD  ALPRAZolam (XANAX) 0.25 MG tablet Take 0.25 mg by mouth every 8 (eight) hours as needed for anxiety.   Yes Historical Provider, MD  alum & mag hydroxide-simeth (GERI-LANTA) 200-200-20 MG/5ML suspension Take 30 mLs by mouth daily as needed for indigestion or heartburn.   Yes Historical Provider, MD  aspirin 81 MG chewable tablet Chew 81 mg by mouth daily.   Yes Historical Provider, MD  B Complex-Biotin-FA (B-COMPLEX PO) Take 1 tablet by mouth daily.   Yes Historical Provider, MD  busPIRone (BUSPAR) 15 MG tablet Take 15 mg by mouth 2 (two) times daily.   Yes Historical Provider, MD  carbidopa-levodopa (SINEMET IR) 25-100 MG per tablet Take 1 tablet by mouth 2 (two) times daily.    Yes Historical Provider, MD  cetirizine (ZYRTEC) 10 MG tablet Take 10 mg by mouth daily as needed for allergies.   Yes Historical Provider, MD  diphenhydrAMINE (BENADRYL) 25 MG tablet Take 2 tablets (50 mg total) by mouth every 4 (four) hours as needed for itching (not relieved by zyrtec). 05/15/14  Yes Ward GivensIva L Knapp, MD  Emollient (CETAPHIL) cream Apply topically as needed. Patient taking differently: Apply 1 application topically daily.  05/29/14  Yes Gerhard Munchobert Lockwood, MD  galantamine (RAZADYNE) 4 MG tablet Take 4 mg by mouth 2 (two) times daily.   Yes Historical Provider, MD  guaiFENesin (Q-TUSSIN) 100 MG/5ML liquid Take 200 mg  by mouth every 6 (six) hours as needed for cough.   Yes Historical Provider, MD  Infant Care Products (DERMACLOUD) CREA Apply 1 application topically 3 (three) times daily as needed (rash).   Yes Historical Provider, MD  loperamide (IMODIUM) 2 MG capsule Take 2 mg by mouth as needed for diarrhea or loose stools.   Yes Historical Provider, MD  magnesium hydroxide (MILK OF MAGNESIA) 400 MG/5ML suspension Take 30 mLs by mouth at bedtime as needed for mild constipation or moderate constipation.   Yes Historical Provider, MD  mirtazapine (REMERON) 15 MG tablet Take 15 mg by  mouth at bedtime.   Yes Historical Provider, MD  Olopatadine HCl (PATADAY) 0.2 % SOLN Apply 1 drop to eye daily as needed (for runny/watery eyes).   Yes Historical Provider, MD  Omega-3 Fatty Acids (FISH OIL) 1200 MG CAPS Take 1 capsule by mouth daily.   Yes Historical Provider, MD  polyethylene glycol (MIRALAX / GLYCOLAX) packet Take 17 g by mouth daily as needed for mild constipation or moderate constipation.   Yes Historical Provider, MD  prazosin (MINIPRESS) 2 MG capsule Take 2 mg by mouth every 6 (six) hours as needed (anxiety).    Yes Historical Provider, MD  QUEtiapine (SEROQUEL) 25 MG tablet Take 25 mg by mouth at bedtime.   Yes Historical Provider, MD  selenium sulfide (ANTI-DANDRUFF) 1 % LOTN Apply 1 application topically 3 (three) times a week.   Yes Historical Provider, MD  sertraline (ZOLOFT) 50 MG tablet Take 50 mg by mouth every other day.    Yes Historical Provider, MD  vitamin C (ASCORBIC ACID) 500 MG tablet Take 500 mg by mouth daily.   Yes Historical Provider, MD  Vitamin D, Ergocalciferol, (DRISDOL) 50000 UNITS CAPS capsule Take 50,000 Units by mouth every 7 (seven) days. Takes on Saturdays.   Yes Historical Provider, MD  vitamin E 400 UNIT capsule Take 400 Units by mouth daily.   Yes Historical Provider, MD  cephALEXin (KEFLEX) 500 MG capsule Take 1 capsule (500 mg total) by mouth 2 (two) times daily. Patient not taking: Reported on 06/29/2014 05/29/14   Gerhard Munch, MD  famotidine (PEPCID) 20 MG tablet Take 1 tablet (20 mg total) by mouth 2 (two) times daily. Patient not taking: Reported on 06/29/2014 05/15/14   Ward Givens, MD  pantoprazole (PROTONIX) 40 MG tablet Take 1 tablet (40 mg total) by mouth daily at 12 noon. Patient not taking: Reported on 05/15/2014 12/17/13   Erick Blinks, MD   BP 141/73 mmHg  Pulse 97  Temp(Src) 101.3 F (38.5 C) (Rectal)  Resp 20  Ht 5\' 5"  (1.651 m)  Wt 130 lb (58.968 kg)  BMI 21.63 kg/m2  SpO2 100% Physical Exam  Constitutional:  He appears well-developed and well-nourished.  HENT:  Head: Normocephalic and atraumatic.  Oropharynx dry.  Eyes: Conjunctivae are normal.  Neck: Normal range of motion.  Cardiovascular: Normal rate, regular rhythm, normal heart sounds and intact distal pulses.   Pulmonary/Chest: Effort normal and breath sounds normal. He has no wheezes.  Abdominal: Soft. Bowel sounds are normal. He exhibits no distension. There is no tenderness. There is no guarding.  Yellow bilious vomit stain on chin.  No evidence of hematemesis.  Musculoskeletal: Normal range of motion.  Neurological: He is alert.  Skin: Skin is warm and dry.  Psychiatric: He has a normal mood and affect.  Nursing note and vitals reviewed.   ED Course  Procedures (including critical care time) Labs Review Labs Reviewed  CBC WITH DIFFERENTIAL - Abnormal; Notable for the following:    HCT 38.7 (*)    Platelets 75 (*)    Neutrophils Relative % 93 (*)    Neutro Abs 9.4 (*)    Lymphocytes Relative 2 (*)    Lymphs Abs 0.2 (*)    All other components within normal limits  COMPREHENSIVE METABOLIC PANEL - Abnormal; Notable for the following:    Glucose, Bld 104 (*)    BUN 38 (*)    GFR calc non Af Amer 83 (*)    Anion gap 4 (*)    All other components within normal limits  URINALYSIS, ROUTINE W REFLEX MICROSCOPIC - Abnormal; Notable for the following:    Specific Gravity, Urine >1.030 (*)    Ketones, ur TRACE (*)    All other components within normal limits  CLOSTRIDIUM DIFFICILE BY PCR  LIPASE, BLOOD  I-STAT CG4 LACTIC ACID, ED  POC OCCULT BLOOD, ED    Imaging Review Ct Abdomen Pelvis W Contrast  06/29/2014   CLINICAL DATA:  Acute onset of vomiting and diarrhea since last evening.  EXAM: CT ABDOMEN AND PELVIS WITH CONTRAST  TECHNIQUE: Multidetector CT imaging of the abdomen and pelvis was performed using the standard protocol following bolus administration of intravenous contrast.  CONTRAST:  25mL OMNIPAQUE IOHEXOL 300  MG/ML SOLN, 100mL OMNIPAQUE IOHEXOL 300 MG/ML SOLN  COMPARISON:  CT scan 12/14/2013.  FINDINGS: Lower chest: The lung bases demonstrate a deep and bibasilar atelectasis. No pleural effusion. The heart is normal in size. No pericardial effusion. The distal esophagus is grossly normal. There is a small hiatal hernia.  Hepatobiliary: Multiple small low-attenuation liver lesions consistent with benign cysts. No worrisome hepatic lesions or intrahepatic biliary dilatation. The gallbladder is normal. No common bile duct dilatation.  Pancreas: Normal  Spleen: Mild splenomegaly. The spleen measures 13 x 13 x 10 cm. No focal lesions.  Adrenals/Urinary Tract: The adrenal glands and kidneys are unremarkable except for a large simple left renal cyst. There is also a small cyst involving the upper pole region of the right kidney. No collecting system abnormalities are demonstrated on delayed images.  Stomach/Bowel: The stomach is moderately distended with contrast. No significant abnormality. The duodenum appears normal. There are dilated proximal small bowel loops with a transition to normal/ decompressed mid distal small bowel loops. This is most likely due to adhesions. No small bowel mass is identified. The proximal jejunum demonstrates areas of mild wall thickening. The colon is unremarkable.  Vascular/Lymphatic: Moderate atherosclerotic calcifications involving the aorta. No dissection or aneurysm. The branch vessels are patent. No mesenteric or retroperitoneal mass or adenopathy. Small scattered lymph nodes are noted. The major venous structures are patent.  Pelvis: The bladder demonstrates a 17 mm calculus. No mass or significant wall thickening. The prostate gland and seminal vesicles are grossly normal. No pelvic mass or adenopathy. No free pelvic fluid collections. Scattered inguinal lymph nodes.  Musculoskeletal: No significant bony findings.  IMPRESSION: CT findings suggest early proximal to mid small bowel  obstruction likely due to adhesions. No mass or adenopathy.  Small hepatic cysts.  Mild splenomegaly with.  Large bladder calculus.   Electronically Signed   By: Loralie ChampagneMark  Gallerani M.D.   On: 06/29/2014 12:05     EKG Interpretation None      MDM   Final diagnoses:  Diarrhea  Bilious vomiting with nausea  Small bowel obstruction    IV fluids, zofran, labs check. Ct scan revealing for possible early sbo.  Dr Manus Gunning saw pt during this visit.  No emesis while hre after receiving zofran.  No diarrhea yet since here.  Recent abx, c dif pending.  Will admit for further eval.    Burgess Amor, PA-C 06/29/14 1230  Glynn Octave, MD 06/29/14 [phone]

## 2014-06-29 NOTE — ED Notes (Signed)
POC occult blood test resulted : negative

## 2014-06-29 NOTE — ED Notes (Signed)
Per Annalisa the [facility], per Dr. Irene LimboGoodrich.

## 2014-06-29 NOTE — ED Notes (Signed)
Attempted NGT with no success. Pt unable to tolerate. Dr. Manus Gunningancour made aware.

## 2014-06-29 NOTE — ED Notes (Signed)
Vomiting and diarrhea since last night.  

## 2014-06-29 NOTE — H&P (Addendum)
History and Physical  Evan Lozano ZOX:[id number] DOB: July 05, 1945 DOA: 06/29/2014  Referring physician: Dr. Manus Gunning in ED PCP: Pcp Not In System  PCP Dr Adriana Simas  Chief Complaint: Vomiting  HPI:  68 year old man resident of [facility] with Advanced Dementia, Parkinson's disease presented with history of fever, vomiting and diarrhea with acute onset 12/30 in the evening. He was febrile in the emergency department and CT abdomen and pelvis suggested early proximal to mid small bowel obstruction likely due to adhesions.   All history obtained from wife at bedside. Patient has advanced dementia, does not really speak and does not clearly recognize family members. He walks quite a bit during the day but otherwise requires total assistance. He has significantly declined to the point where it is emotionally very difficult for her to visit him every day but she visits as much as possible. He has recently had some problems with eczema and skin infection but otherwise has been in fairly good health and eats very well. She was told that he began vomiting and having diarrhea last evening.  Patient is essentially nonverbal and Right no history.  In the emergency department febrile 101.3. Vital signs stable. No hypoxia. NG tube not successful. Complete metabolic panel unremarkable. Lactic acid normal. Chronic thrombocytopenia stable. Urinalysis negative.  Review of Systems:  Very limited, as above otherwise not obtainable.   Past Medical History  Diagnosis Date  . Alzheimer disease   . Hypertension   . Dementia   . BPH (benign prostatic hyperplasia)   . HLD (hyperlipidemia)   . Normocytic anemia   . Parkinson disease   . Bladder calculus 06/29/2014    Past Surgical History  Procedure Laterality Date  . Rectal surgery      Social History:  reports that he has quit smoking. He does not have any smokeless tobacco history on file. He reports that he does not drink alcohol or use illicit  drugs.  No Known Allergies  Family History  Problem Relation Age of Onset  . Alzheimer's disease Father      Prior to Admission medications   Medication Sig Start Date End Date Taking? Authorizing Provider  acetaminophen (TYLENOL) 500 MG tablet Take 500 mg by mouth every 4 (four) hours as needed. For fever 99.5 to 101. Monitor temperature 3 times daily until normal above 101.   Yes Historical Provider, MD  acidophilus (RISAQUAD) CAPS capsule Take 1 capsule by mouth daily.   Yes Historical Provider, MD  ALPRAZolam (XANAX) 0.25 MG tablet Take 0.25 mg by mouth every 8 (eight) hours as needed for anxiety.   Yes Historical Provider, MD  alum & mag hydroxide-simeth (GERI-LANTA) 200-200-20 MG/5ML suspension Take 30 mLs by mouth daily as needed for indigestion or heartburn.   Yes Historical Provider, MD  aspirin 81 MG chewable tablet Chew 81 mg by mouth daily.   Yes Historical Provider, MD  B Complex-Biotin-FA (B-COMPLEX PO) Take 1 tablet by mouth daily.   Yes Historical Provider, MD  busPIRone (BUSPAR) 15 MG tablet Take 15 mg by mouth 2 (two) times daily.   Yes Historical Provider, MD  carbidopa-levodopa (SINEMET IR) 25-100 MG per tablet Take 1 tablet by mouth 2 (two) times daily.    Yes Historical Provider, MD  cetirizine (ZYRTEC) 10 MG tablet Take 10 mg by mouth daily as needed for allergies.   Yes Historical Provider, MD  diphenhydrAMINE (BENADRYL) 25 MG tablet Take 2 tablets (50 mg total) by mouth every 4 (four) hours as needed for itching (  not relieved by zyrtec). 05/15/14  Yes Ward GivensIva L Knapp, MD  Emollient (CETAPHIL) cream Apply topically as needed. Patient taking differently: Apply 1 application topically daily.  05/29/14  Yes Gerhard Munchobert Lockwood, MD  galantamine (RAZADYNE) 4 MG tablet Take 4 mg by mouth 2 (two) times daily.   Yes Historical Provider, MD  guaiFENesin (Q-TUSSIN) 100 MG/5ML liquid Take 200 mg by mouth every 6 (six) hours as needed for cough.   Yes Historical Provider, MD  Infant  Care Products (DERMACLOUD) CREA Apply 1 application topically 3 (three) times daily as needed (rash).   Yes Historical Provider, MD  loperamide (IMODIUM) 2 MG capsule Take 2 mg by mouth as needed for diarrhea or loose stools.   Yes Historical Provider, MD  magnesium hydroxide (MILK OF MAGNESIA) 400 MG/5ML suspension Take 30 mLs by mouth at bedtime as needed for mild constipation or moderate constipation.   Yes Historical Provider, MD  mirtazapine (REMERON) 15 MG tablet Take 15 mg by mouth at bedtime.   Yes Historical Provider, MD  Olopatadine HCl (PATADAY) 0.2 % SOLN Apply 1 drop to eye daily as needed (for runny/watery eyes).   Yes Historical Provider, MD  Omega-3 Fatty Acids (FISH OIL) 1200 MG CAPS Take 1 capsule by mouth daily.   Yes Historical Provider, MD  polyethylene glycol (MIRALAX / GLYCOLAX) packet Take 17 g by mouth daily as needed for mild constipation or moderate constipation.   Yes Historical Provider, MD  prazosin (MINIPRESS) 2 MG capsule Take 2 mg by mouth every 6 (six) hours as needed (anxiety).    Yes Historical Provider, MD  QUEtiapine (SEROQUEL) 25 MG tablet Take 25 mg by mouth at bedtime.   Yes Historical Provider, MD  selenium sulfide (ANTI-DANDRUFF) 1 % LOTN Apply 1 application topically 3 (three) times a week.   Yes Historical Provider, MD  sertraline (ZOLOFT) 50 MG tablet Take 50 mg by mouth every other day.    Yes Historical Provider, MD  vitamin C (ASCORBIC ACID) 500 MG tablet Take 500 mg by mouth daily.   Yes Historical Provider, MD  Vitamin D, Ergocalciferol, (DRISDOL) 50000 UNITS CAPS capsule Take 50,000 Units by mouth every 7 (seven) days. Takes on Saturdays.   Yes Historical Provider, MD  vitamin E 400 UNIT capsule Take 400 Units by mouth daily.   Yes Historical Provider, MD  cephALEXin (KEFLEX) 500 MG capsule Take 1 capsule (500 mg total) by mouth 2 (two) times daily. Patient not taking: Reported on 06/29/2014 05/29/14   Gerhard Munchobert Lockwood, MD  famotidine (PEPCID) 20 MG  tablet Take 1 tablet (20 mg total) by mouth 2 (two) times daily. Patient not taking: Reported on 06/29/2014 05/15/14   Ward GivensIva L Knapp, MD  pantoprazole (PROTONIX) 40 MG tablet Take 1 tablet (40 mg total) by mouth daily at 12 noon. Patient not taking: Reported on 05/15/2014 12/17/13   Erick BlinksJehanzeb Memon, MD   Physical Exam: Filed Vitals:   06/29/14 0853 06/29/14 1121 06/29/14 1148  BP: 131/78  141/73  Pulse: 101  97  Temp: 100.1 F (37.8 C) 101.3 F (38.5 C)   TempSrc: Rectal Rectal   Resp: 20  20  Height: 5\' 5"  (1.651 m)    Weight: 58.968 kg (130 lb)    SpO2: 97%  100%    General: Examined in the emergency department  Appears calm and comfortable Eyes: Pupils, irises, lids appear grossly unremarkable ENT: grossly normal hearing, lips & tongue Neck: Appears grossly unremarkable Cardiovascular: RRR, no m/r/g. No LE edema. Respiratory: CTA  bilaterally, no w/r/r. Normal respiratory effort. Abdomen: soft, ntnd Skin: Resolving macular rash over the extremities and chest. Musculoskeletal: grossly normal tone BUE/BLE Psychiatric: Unable to obtain. Patient remains awake throughout examination and history taking but does not interact appropriately. Neurologic: grossly non-focal.  Wt Readings from Last 3 Encounters:  06/29/14 58.968 kg (130 lb)  05/31/14 53.071 kg (117 lb)  12/26/13 53.071 kg (117 lb)    Labs on Admission:  Basic Metabolic Panel:  Recent Labs Lab 06/29/14 0931  NA 138  K 3.8  CL 107  CO2 27  GLUCOSE 104*  BUN 38*  CREATININE 0.98  CALCIUM 8.8    Liver Function Tests:  Recent Labs Lab 06/29/14 0931  AST 26  ALT 23  ALKPHOS 59  BILITOT 1.0  PROT 6.6  ALBUMIN 4.0    Recent Labs Lab 06/29/14 0931  LIPASE 37   No results for input(s): AMMONIA in the last 168 hours.  CBC:  Recent Labs Lab 06/29/14 0931  WBC 10.1  NEUTROABS 9.4*  HGB 13.7  HCT 38.7*  MCV 90.0  PLT 75*     Radiological Exams on Admission: Ct Abdomen Pelvis W  Contrast  06/29/2014   CLINICAL DATA:  Acute onset of vomiting and diarrhea since last evening.  EXAM: CT ABDOMEN AND PELVIS WITH CONTRAST  TECHNIQUE: Multidetector CT imaging of the abdomen and pelvis was performed using the standard protocol following bolus administration of intravenous contrast.  CONTRAST:  25mL OMNIPAQUE IOHEXOL 300 MG/ML SOLN, 100mL OMNIPAQUE IOHEXOL 300 MG/ML SOLN  COMPARISON:  CT scan 12/14/2013.  FINDINGS: Lower chest: The lung bases demonstrate a deep and bibasilar atelectasis. No pleural effusion. The heart is normal in size. No pericardial effusion. The distal esophagus is grossly normal. There is a small hiatal hernia.  Hepatobiliary: Multiple small low-attenuation liver lesions consistent with benign cysts. No worrisome hepatic lesions or intrahepatic biliary dilatation. The gallbladder is normal. No common bile duct dilatation.  Pancreas: Normal  Spleen: Mild splenomegaly. The spleen measures 13 x 13 x 10 cm. No focal lesions.  Adrenals/Urinary Tract: The adrenal glands and kidneys are unremarkable except for a large simple left renal cyst. There is also a small cyst involving the upper pole region of the right kidney. No collecting system abnormalities are demonstrated on delayed images.  Stomach/Bowel: The stomach is moderately distended with contrast. No significant abnormality. The duodenum appears normal. There are dilated proximal small bowel loops with a transition to normal/ decompressed mid distal small bowel loops. This is most likely due to adhesions. No small bowel mass is identified. The proximal jejunum demonstrates areas of mild wall thickening. The colon is unremarkable.  Vascular/Lymphatic: Moderate atherosclerotic calcifications involving the aorta. No dissection or aneurysm. The branch vessels are patent. No mesenteric or retroperitoneal mass or adenopathy. Small scattered lymph nodes are noted. The major venous structures are patent.  Pelvis: The bladder  demonstrates a 17 mm calculus. No mass or significant wall thickening. The prostate gland and seminal vesicles are grossly normal. No pelvic mass or adenopathy. No free pelvic fluid collections. Scattered inguinal lymph nodes.  Musculoskeletal: No significant bony findings.  IMPRESSION: CT findings suggest early proximal to mid small bowel obstruction likely due to adhesions. No mass or adenopathy.  Small hepatic cysts.  Mild splenomegaly with.  Large bladder calculus.   Electronically Signed   By: Loralie ChampagneMark  Gallerani M.D.   On: 06/29/2014 12:05     Principal Problem:   SBO (small bowel obstruction) Active Problems:   Alzheimer's  dementia   Parkinson disease   Bladder calculus   Thrombocytopenia   Vomiting and diarrhea   Assessment/Plan                                                       1. Early small bowel obstruction, suspect secondary to gastroenteritis, viral. 2. Vomiting, diarrhea, fever 101.3. Fecal occult blood negative. Suspect viral gastroenteritis. 3. Thrombocytopenia appears to be chronic, stable. Etiology unclear. Follow-up as an outpatient. 4. Large bladder calculus 5. Alzheimer's disease 6. Parkinson's disease   Admit to medical bed for vomiting, diarrhea, suspect gastroenteritis. Manage small bowel obstruction with bowel rest, supportive care, IV fluids, nothing by mouth; refused NGT. Suspect viral gastroenteritis as underlying etiology although CT suggests adhesive disease. Doubt severe intra-abdominal process at this time.  Check GI pathogen panel  CBC, KUB in the morning  Outpatient follow-up for large bladder calculus with urology if clinically indicated  Discussed above in detail with wife at bedside. She reports he is DNR. We discussed clinical possibilities including spontaneous improvement as well as clinical decline, progressive obstruction, development of peritonitis, sepsis, severe complications and even death. Given his advanced dementia, she is certain that  he would not want any aggressive measures and in the event of abdominal catastrophe she would want him to be made comfortable rather than contemplate surgery. I think this is quite reasonable, therefore no surgical consultation is indicated.  Code Status: DNR  DVT prophylaxis:SCDs Family Communication:  Disposition Plan/Anticipated LOS: obs, 1-2 days  Time spent: 60 minutes  Brendia Sacks, MD  [facility] Pager [phone] 06/29/2014, 12:47 PM

## 2014-06-30 ENCOUNTER — Inpatient Hospital Stay (HOSPITAL_COMMUNITY): Payer: Medicare Other

## 2014-06-30 LAB — BASIC METABOLIC PANEL
BUN: 20 mg/dL (ref 6–23)
CALCIUM: 8.3 mg/dL — AB (ref 8.4–10.5)
CHLORIDE: 109 meq/L (ref 96–112)
CO2: 27 mmol/L (ref 19–32)
Creatinine, Ser: 0.95 mg/dL (ref 0.50–1.35)
GFR calc Af Amer: >90 mL/min (ref >90)
GFR calc non Af Amer: 84 mL/min — ABNORMAL LOW (ref >90)
GLUCOSE: 79 mg/dL (ref 70–99)
Potassium: 3.6 mmol/L (ref 3.5–5.1)
Sodium: 138 mmol/L (ref 135–145)

## 2014-06-30 LAB — MRSA PCR SCREENING: MRSA by PCR: POSITIVE — AB

## 2014-06-30 LAB — CBC
HCT: 34.6 % — ABNORMAL LOW (ref 39.0–52.0)
Hemoglobin: 12.3 g/dL — ABNORMAL LOW (ref 13.0–17.0)
MCH: 31.9 pg (ref 26.0–34.0)
MCHC: 35.5 g/dL (ref 30.0–36.0)
MCV: 89.9 fL (ref 78.0–100.0)
PLATELETS: 70 10*3/uL — AB (ref 150–400)
RBC: 3.85 MIL/uL — ABNORMAL LOW (ref 4.22–5.81)
RDW: 14.9 % (ref 11.5–15.5)
WBC: 5.7 10*3/uL (ref 4.0–10.5)

## 2014-06-30 MED ORDER — METRONIDAZOLE 500 MG PO TABS: 500.0000 mg | ORAL_TABLET | Freq: Three times a day (TID) | ORAL | Status: AC

## 2014-06-30 MED ORDER — METRONIDAZOLE 500 MG PO TABS
500.0000 mg | ORAL_TABLET | ORAL | Status: AC
  Administered 2014-06-30: 500 mg via ORAL
  Filled 2014-06-30: qty 1

## 2014-06-30 MED ORDER — CIPROFLOXACIN HCL 250 MG PO TABS
500.0000 mg | ORAL_TABLET | ORAL | Status: AC
  Administered 2014-06-30: 500 mg via ORAL
  Filled 2014-06-30: qty 2

## 2014-06-30 MED ORDER — CIPROFLOXACIN HCL 500 MG PO TABS: 500.0000 mg | ORAL_TABLET | Freq: Two times a day (BID) | ORAL | Status: AC

## 2014-06-30 NOTE — Clinical Social Work Note (Signed)
Pt d/c today back to [facility]. Pt's wife and facility aware and agreeable. Myrene Buddy at [facility] requested to make sure pt can ambulate. RN notified. D/C summary faxed. Facility agreeable to no FL2 as pt in observation overnight. Pt's wife to provide transport.   Derenda Fennel, [place] [phone]

## 2014-06-30 NOTE — Progress Notes (Signed)
Patient discharged with instructions, prescription, and care notes.  Verbalized understanding via teach back.  IV was removed and the site was WNL. Patient voiced no further complaints or concerns at the time of discharge.  Appointments scheduled per instructions.  Patient left the floor via w/c with staff and family in stable condition.   Patient had 1 large BM prior to leaving the floor.  Patient was cleaned and a depend was placed that was from the ED.  The patient wife has some concerns about him being discharged and Dr. Rito Ehrlich was notified.  MD stated that the patient was on ABX  And that it should be fine for the patient to go.  Pt wife made aware and she verbalized understanding.  She was given the packet to be taken to the facility.  The patient was also ambulated approx. 250 ft he tolerated the ambulation without difficulty.  SW was made aware.

## 2014-06-30 NOTE — Discharge Summary (Signed)
Discharge Summary  Evan Lozano WUJ:[id number] DOB: Oct 10, 1945  PCP: Pcp Not In System  Admit date: 06/29/2014 Discharge date: 06/30/2014  Time spent: 25 minutes  Recommendations for Outpatient Follow-up:  1. Patient will return back to assisted living memory care unit  2. New medication: Flagyl 500 mg every 8 hours times one week 3. New medication: Cipro 500 mg twice a day 1 week  Discharge Diagnoses:  Active Hospital Problems   Diagnosis Date Noted  . SBO (small bowel obstruction) 06/29/2014  . Bladder calculus 06/29/2014  . Thrombocytopenia 06/29/2014  . Vomiting and diarrhea 06/29/2014  . Alzheimer's dementia 12/14/2013  . Parkinson disease 12/14/2013    Resolved Hospital Problems   Diagnosis Date Noted Date Resolved  No resolved problems to display.    Discharge Condition: Improved, being discharged back to assisted living  Diet recommendation: Regular diet  Filed Weights   06/29/14 0853 06/29/14 1501  Weight: 58.968 kg (130 lb) 61.508 kg (135 lb 9.6 oz)    History of present illness:  Patient is a 69 year old male with past medical history Parkinson's disease, Alzheimer's dementia who resides in an assisted living memory care unit and was admitted on 12/31 with one-day history of nausea and vomiting plus some diarrhea 1 day. Patient noted to be febrile in the emergency room and CT scan noted signs of bowel obstruction. After discussion with patient's wife, the plan for no aggressive measures. The patient's obstruction did not improve or his condition declined the plan was to make for comfort care and patient was brought into the hospitalist service  Hospital Course:  Principal Problem:   SBO (small bowel obstruction): Looks to be self resolved. Follow-up x-ray this morning notes normal bowel gas pattern. Patient started on by mouth and tolerated. Active Problems:   Alzheimer's dementia: Patient back to memory care unit, no aggressive measures   Parkinson disease:  Stable    Bladder calculus: Incidental finding, patient does not appear to be bothered by this.   Thrombocytopenia: Incidentally noted. Patient has a previous history of thrombocytopenia. No active evidence of bleeding.    Vomiting and diarrhea: Given persistent fevers, we'll treat as bacterial. Will give dose patient of Cipro and Flagyl prior to discharge and then continue for another 1 week course with both   Procedures:  None  Consultations: None  Discharge Exam: BP 113/59 mmHg  Pulse 60  Temp(Src) 99.1 F (37.3 C) (Oral)  Resp 16  Ht  (1.651 m)  Wt 61.508 kg (135 lb 9.6 oz)  BMI 22.57 kg/m2  SpO2 100%  General: Awake, chronic confusion Cardiovascular: Regular rate and rhythm, S1-S2 Respiratory: Clear to auscultation bilaterally  Discharge Instructions You were cared for by a hospitalist during your hospital stay. If you have any questions about your discharge medications or the care you received while you were in the hospital after you are discharged, you can call the unit and asked to speak with the hospitalist on call if the hospitalist that took care of you is not available. Once you are discharged, your primary care physician will handle any further medical issues. Please note that NO REFILLS for any discharge medications will be authorized once you are discharged, as it is imperative that you return to your primary care physician (or establish a relationship with a primary care physician if you do not have one) for your aftercare needs so that they can reassess your need for medications and monitor your lab values.  Discharge Instructions    Diet -  low sodium heart healthy    Complete by:  As directed      Increase activity slowly    Complete by:  As directed             Medication List    STOP taking these medications        cephALEXin 500 MG capsule  Commonly known as:  KEFLEX      TAKE these medications        acetaminophen 500 MG tablet  Commonly  known as:  TYLENOL  Take 500 mg by mouth every 4 (four) hours as needed. For fever 99.5 to 101. Monitor temperature 3 times daily until normal above 101.     acidophilus Caps capsule  Take 1 capsule by mouth daily.     ALPRAZolam 0.25 MG tablet  Commonly known as:  XANAX  Take 0.25 mg by mouth every 8 (eight) hours as needed for anxiety.     ANTI-DANDRUFF 1 % Lotn  Generic drug:  selenium sulfide  Apply 1 application topically 3 (three) times a week.     aspirin 81 MG chewable tablet  Chew 81 mg by mouth daily.     B-COMPLEX PO  Take 1 tablet by mouth daily.     busPIRone 15 MG tablet  Commonly known as:  BUSPAR  Take 15 mg by mouth 2 (two) times daily.     carbidopa-levodopa 25-100 MG per tablet  Commonly known as:  SINEMET IR  Take 1 tablet by mouth 2 (two) times daily.     cetaphil cream  Apply topically as needed.     cetirizine 10 MG tablet  Commonly known as:  ZYRTEC  Take 10 mg by mouth daily as needed for allergies.     ciprofloxacin 500 MG tablet  Commonly known as:  CIPRO  Take 1 tablet (500 mg total) by mouth 2 (two) times daily.     DERMACLOUD Crea  Apply 1 application topically 3 (three) times daily as needed (rash).     diphenhydrAMINE 25 MG tablet  Commonly known as:  BENADRYL  Take 2 tablets (50 mg total) by mouth every 4 (four) hours as needed for itching (not relieved by zyrtec).     famotidine 20 MG tablet  Commonly known as:  PEPCID  Take 1 tablet (20 mg total) by mouth 2 (two) times daily.     Fish Oil 1200 MG Caps  Take 1 capsule by mouth daily.     galantamine 4 MG tablet  Commonly known as:  RAZADYNE  Take 4 mg by mouth 2 (two) times daily.     GERI-LANTA 200-200-20 MG/5ML suspension  Generic drug:  alum & mag hydroxide-simeth  Take 30 mLs by mouth daily as needed for indigestion or heartburn.     loperamide 2 MG capsule  Commonly known as:  IMODIUM  Take 2 mg by mouth as needed for diarrhea or loose stools.     magnesium  hydroxide 400 MG/5ML suspension  Commonly known as:  MILK OF MAGNESIA  Take 30 mLs by mouth at bedtime as needed for mild constipation or moderate constipation.     metroNIDAZOLE 500 MG tablet  Commonly known as:  FLAGYL  Take 1 tablet (500 mg total) by mouth 3 (three) times daily.     mirtazapine 15 MG tablet  Commonly known as:  REMERON  Take 15 mg by mouth at bedtime.     pantoprazole 40 MG tablet  Commonly known as:  PROTONIX  Take 1 tablet (40 mg total) by mouth daily at 12 noon.     PATADAY 0.2 % Soln  Generic drug:  Olopatadine HCl  Apply 1 drop to eye daily as needed (for runny/watery eyes).     polyethylene glycol packet  Commonly known as:  MIRALAX / GLYCOLAX  Take 17 g by mouth daily as needed for mild constipation or moderate constipation.     prazosin 2 MG capsule  Commonly known as:  MINIPRESS  Take 2 mg by mouth every 6 (six) hours as needed (anxiety).     Q-TUSSIN 100 MG/5ML liquid  Generic drug:  guaiFENesin  Take 200 mg by mouth every 6 (six) hours as needed for cough.     QUEtiapine 25 MG tablet  Commonly known as:  SEROQUEL  Take 25 mg by mouth at bedtime.     sertraline 50 MG tablet  Commonly known as:  ZOLOFT  Take 50 mg by mouth every other day.     vitamin C 500 MG tablet  Commonly known as:  ASCORBIC ACID  Take 500 mg by mouth daily.     Vitamin D (Ergocalciferol) 50000 UNITS Caps capsule  Commonly known as:  DRISDOL  Take 50,000 Units by mouth every 7 (seven) days. Takes on Saturdays.     vitamin E 400 UNIT capsule  Take 400 Units by mouth daily.        No Known Allergies    The results of significant diagnostics from this hospitalization (including imaging, microbiology, ancillary and laboratory) are listed below for reference.    Significant Diagnostic Studies: Dg Abd 1 View  06/30/2014   CLINICAL DATA:  Small bowel obstruction, dementia  EXAM: ABDOMEN - 1 VIEW  COMPARISON:  CT 06/29/2014  FINDINGS: Stomach and small bowel  appear decompressed. Progression of enteral contrast material into the decompressed colon.  Bladder calculus.  Regional bones unremarkable.  IMPRESSION: 1. Nonobstructive bowel gas pattern. 2. Bladder calculus.   Electronically Signed   By: Oley Balm M.D.   On: 06/30/2014 09:46   Ct Abdomen Pelvis W Contrast  06/29/2014   CLINICAL DATA:  Acute onset of vomiting and diarrhea since last evening.  EXAM: CT ABDOMEN AND PELVIS WITH CONTRAST  TECHNIQUE: Multidetector CT imaging of the abdomen and pelvis was performed using the standard protocol following bolus administration of intravenous contrast.  CONTRAST:  25mL OMNIPAQUE IOHEXOL 300 MG/ML SOLN, OMNIPAQUE IOHEXOL 300 MG/ML SOLN  COMPARISON:  CT scan 12/14/2013.  FINDINGS: Lower chest: The lung bases demonstrate a deep and bibasilar atelectasis. No pleural effusion. The heart is normal in size. No pericardial effusion. The distal esophagus is grossly normal. There is a small hiatal hernia.  Hepatobiliary: Multiple small low-attenuation liver lesions consistent with benign cysts. No worrisome hepatic lesions or intrahepatic biliary dilatation. The gallbladder is normal. No common bile duct dilatation.  Pancreas: Normal  Spleen: Mild splenomegaly. The spleen measures 13 x 13 x 10 cm. No focal lesions.  Adrenals/Urinary Tract: The adrenal glands and kidneys are unremarkable except for a large simple left renal cyst. There is also a small cyst involving the upper pole region of the right kidney. No collecting system abnormalities are demonstrated on delayed images.  Stomach/Bowel: The stomach is moderately distended with contrast. No significant abnormality. The duodenum appears normal. There are dilated proximal small bowel loops with a transition to normal/ decompressed mid distal small bowel loops. This is most likely due to adhesions. No small bowel mass is identified. The proximal  jejunum demonstrates areas of mild wall thickening. The colon is  unremarkable.  Vascular/Lymphatic: Moderate atherosclerotic calcifications involving the aorta. No dissection or aneurysm. The branch vessels are patent. No mesenteric or retroperitoneal mass or adenopathy. Small scattered lymph nodes are noted. The major venous structures are patent.  Pelvis: The bladder demonstrates a 17 mm calculus. No mass or significant wall thickening. The prostate gland and seminal vesicles are grossly normal. No pelvic mass or adenopathy. No free pelvic fluid collections. Scattered inguinal lymph nodes.  Musculoskeletal: No significant bony findings.  IMPRESSION: CT findings suggest early proximal to mid small bowel obstruction likely due to adhesions. No mass or adenopathy.  Small hepatic cysts.  Mild splenomegaly with.  Large bladder calculus.   Electronically Signed   By: Loralie Champagne M.D.   On: 06/29/2014 12:05    Microbiology: Recent Results (from the past 240 hour(s))  MRSA PCR Screening     Status: Abnormal   Collection Time: 06/30/14 12:30 AM  Result Value Ref Range Status   MRSA by PCR POSITIVE (A) NEGATIVE Final    Comment:        The GeneXpert MRSA Assay (FDA approved for NASAL specimens only), is one component of a comprehensive MRSA colonization surveillance program. It is not intended to diagnose MRSA infection nor to guide or monitor treatment for MRSA infections. RESULT CALLED TO, READ BACK BY AND VERIFIED WITH: MCKINNEY S AT 0215 ON [id number] BY FORSYTH K      Labs: Basic Metabolic Panel:  Recent Labs Lab 06/29/14 0931 06/30/14 0857  NA 138 138  K 3.8 3.6  CL 107 109  CO2 27 27  GLUCOSE 104* 79  BUN 38* 20  CREATININE 0.98 0.95  CALCIUM 8.8 8.3*   Liver Function Tests:  Recent Labs Lab 06/29/14 0931  AST 26  ALT 23  ALKPHOS 59  BILITOT 1.0  PROT 6.6  ALBUMIN 4.0    Recent Labs Lab 06/29/14 0931  LIPASE 37   No results for input(s): AMMONIA in the last 168 hours. CBC:  Recent Labs Lab 06/29/14 0931 06/30/14 0857    WBC 10.1 5.7  NEUTROABS 9.4*  --   HGB 13.7 12.3*  HCT 38.7* 34.6*  MCV 90.0 89.9  PLT 75* 70*   Cardiac Enzymes: No results for input(s): CKTOTAL, CKMB, CKMBINDEX, TROPONINI in the last 168 hours. BNP: BNP (last 3 results) No results for input(s): PROBNP in the last 8760 hours. CBG: No results for input(s): GLUCAP in the last 168 hours.     Signed:  Hollice Espy  [facility] 06/30/2014, 1:37 PM

## 2014-06-30 NOTE — Clinical Social Work Psychosocial (Signed)
Clinical Social Work Department BRIEF PSYCHOSOCIAL ASSESSMENT 06/30/2014  Patient:  TRIGG, DELAROCHA     Account Number:  [id number]     Admit date:  06/29/2014  Clinical Social Worker:  Wyatt Haste  Date/Time:  06/30/2014 10:57 AM  Referred by:  CSW  Date Referred:  06/30/2014 Referred for  ALF Placement   Other Referral:   Interview type:  Family Other interview type:   wife- June    PSYCHOSOCIAL DATA Living Status:  FACILITY Admitted from facility:  [facility] Level of care:  Assisted Living Primary support name:  June Primary support relationship to patient:  SPOUSE Degree of support available:   very supportive    CURRENT CONCERNS Current Concerns  Post-Acute Placement   Other Concerns:    SOCIAL WORK ASSESSMENT / PLAN CSW met with pt's wife at bedside. Pt awake, but has advanced dementia. Pt admitted with small bowel obstruction. Decision was made yesterday for no aggressive measures. Pt's wife is very involved and supportive and visits or calls to check on pt daily. He has been a resident at [facility] on the memory care unit for about 3 years now. Pt's wife states he was diagnosed with dementia several years ago, but admits she was "in denial" and had been ignoring early signs for awhile. She feels that pt still recognizes her at times. They have one son who lives in [place] and at his last visit, pt did not engage with him. Pt is minimally verbal. Pt's wife is very protective of pt and advocates for his needs frequently. She requests return to [facility] when stable as pt is comfortable there and she feels things are going well. Per Kendrick Fries at facility, pt requires assist with all ADLs, but ambulates independently. He is okay to return.   Assessment/plan status:  Psychosocial Support/Ongoing Assessment of Needs Other assessment/ plan:   Information/referral to community resources:   [facility]    PATIENT'S/FAMILY'S RESPONSE TO PLAN OF  CARE: Pt unable to participate in assessment due to dementia. Plan return to [facility] when medically stable. CSW will continue to follow.       Benay Pike, [place]

## 2014-07-03 LAB — GI PATHOGEN PANEL BY PCR, STOOL
C DIFFICILE TOXIN A/B: NEGATIVE
CRYPTOSPORIDIUM BY PCR: NEGATIVE
Campylobacter by PCR: NEGATIVE
E coli (ETEC) LT/ST: NEGATIVE
E coli (STEC): NEGATIVE
E coli 0157 by PCR: NEGATIVE
G LAMBLIA BY PCR: NEGATIVE
Norovirus GI/GII: POSITIVE
ROTAVIRUS A BY PCR: NEGATIVE
Salmonella by PCR: NEGATIVE
Shigella by PCR: NEGATIVE

## 2014-07-06 NOTE — Care Management Utilization Note (Signed)
UR complete 

## 2014-07-08 ENCOUNTER — Encounter (HOSPITAL_COMMUNITY): Payer: Self-pay

## 2014-07-08 ENCOUNTER — Emergency Department (HOSPITAL_COMMUNITY)
Admission: EM | Admit: 2014-07-08 | Discharge: 2014-07-08 | Disposition: A | Discharge status: Home / Self Care | Payer: Medicare Other | Attending: Emergency Medicine | Admitting: Emergency Medicine

## 2014-07-08 ENCOUNTER — Emergency Department (HOSPITAL_COMMUNITY): Payer: Medicare Other

## 2014-07-08 DIAGNOSIS — Z7982 Long term (current) use of aspirin: Secondary | ICD-10-CM | POA: Insufficient documentation

## 2014-07-08 DIAGNOSIS — I10 Essential (primary) hypertension: Secondary | ICD-10-CM | POA: Insufficient documentation

## 2014-07-08 DIAGNOSIS — Z87891 Personal history of nicotine dependence: Secondary | ICD-10-CM | POA: Insufficient documentation

## 2014-07-08 DIAGNOSIS — Z792 Long term (current) use of antibiotics: Secondary | ICD-10-CM | POA: Diagnosis not present

## 2014-07-08 DIAGNOSIS — Z8639 Personal history of other endocrine, nutritional and metabolic disease: Secondary | ICD-10-CM | POA: Diagnosis not present

## 2014-07-08 DIAGNOSIS — F0281 Dementia in other diseases classified elsewhere with behavioral disturbance: Secondary | ICD-10-CM | POA: Diagnosis not present

## 2014-07-08 DIAGNOSIS — Z87438 Personal history of other diseases of male genital organs: Secondary | ICD-10-CM | POA: Insufficient documentation

## 2014-07-08 DIAGNOSIS — G20 Parkinson's disease: Secondary | ICD-10-CM | POA: Diagnosis not present

## 2014-07-08 DIAGNOSIS — R1110 Vomiting, unspecified: Secondary | ICD-10-CM

## 2014-07-08 DIAGNOSIS — Z79899 Other long term (current) drug therapy: Secondary | ICD-10-CM | POA: Diagnosis not present

## 2014-07-08 DIAGNOSIS — Z87442 Personal history of urinary calculi: Secondary | ICD-10-CM | POA: Diagnosis not present

## 2014-07-08 LAB — COMPREHENSIVE METABOLIC PANEL
ALT: 5 U/L (ref 0–53)
AST: 32 U/L (ref 0–37)
Albumin: 3.5 g/dL (ref 3.5–5.2)
Alkaline Phosphatase: 42 U/L (ref 39–117)
Anion gap: 5 (ref 5–15)
BILIRUBIN TOTAL: 0.9 mg/dL (ref 0.3–1.2)
BUN: 22 mg/dL (ref 6–23)
CALCIUM: 8.4 mg/dL (ref 8.4–10.5)
CHLORIDE: 108 meq/L (ref 96–112)
CO2: 29 mmol/L (ref 19–32)
Creatinine, Ser: 0.98 mg/dL (ref 0.50–1.35)
GFR calc Af Amer: >90 mL/min (ref >90)
GFR calc non Af Amer: 83 mL/min — ABNORMAL LOW (ref >90)
GLUCOSE: 116 mg/dL — AB (ref 70–99)
Potassium: 3.5 mmol/L (ref 3.5–5.1)
Sodium: 142 mmol/L (ref 135–145)
Total Protein: 5.7 g/dL — ABNORMAL LOW (ref 6.0–8.3)

## 2014-07-08 LAB — CBC WITH DIFFERENTIAL/PLATELET
BASOS ABS: 0 10*3/uL (ref 0.0–0.1)
Basophils Relative: 0 % (ref 0–1)
EOS ABS: 0.1 10*3/uL (ref 0.0–0.7)
EOS PCT: 1 % (ref 0–5)
HEMATOCRIT: 33.2 % — AB (ref 39.0–52.0)
HEMOGLOBIN: 11.8 g/dL — AB (ref 13.0–17.0)
LYMPHS PCT: 8 % — AB (ref 12–46)
Lymphs Abs: 0.6 10*3/uL — ABNORMAL LOW (ref 0.7–4.0)
MCH: 32.2 pg (ref 26.0–34.0)
MCHC: 35.5 g/dL (ref 30.0–36.0)
MCV: 90.7 fL (ref 78.0–100.0)
MONO ABS: 0.3 10*3/uL (ref 0.1–1.0)
Monocytes Relative: 4 % (ref 3–12)
NEUTROS ABS: 6.4 10*3/uL (ref 1.7–7.7)
Neutrophils Relative %: 87 % — ABNORMAL HIGH (ref 43–77)
PLATELETS: 101 10*3/uL — AB (ref 150–400)
RBC: 3.66 MIL/uL — ABNORMAL LOW (ref 4.22–5.81)
RDW: 15.1 % (ref 11.5–15.5)
WBC: 7.3 10*3/uL (ref 4.0–10.5)

## 2014-07-08 MED ORDER — SODIUM CHLORIDE 0.9 % IV BOLUS (SEPSIS): 1000.0000 mL | Freq: Once | INTRAVENOUS | Status: DC

## 2014-07-08 MED ORDER — ONDANSETRON HCL 4 MG/2ML IJ SOLN
4.0000 mg | Freq: Once | INTRAMUSCULAR | Status: AC
  Administered 2014-07-08: 4 mg via INTRAVENOUS
  Filled 2014-07-08: qty 2

## 2014-07-08 MED ORDER — ONDANSETRON 4 MG PO TBDP
4.0000 mg | ORAL_TABLET | Freq: Once | ORAL | Status: AC
  Administered 2014-07-08: 4 mg via ORAL

## 2014-07-08 MED ORDER — ONDANSETRON 4 MG PO TBDP
ORAL_TABLET | ORAL | Status: AC
  Filled 2014-07-08: qty 2

## 2014-07-08 MED ORDER — ONDANSETRON 4 MG PO TBDP: ORAL_TABLET | ORAL | Status: AC

## 2014-07-08 MED ORDER — ONDANSETRON 4 MG PO TBDP: 4.0000 mg | ORAL_TABLET | Freq: Once | ORAL | Status: DC

## 2014-07-08 NOTE — ED Provider Notes (Signed)
CSN: [id number]     Arrival date & time 07/08/14  1859 History   First MD Initiated Contact with Patient 07/08/14 1900     Chief Complaint  Patient presents with  . Emesis     (Consider location/radiation/quality/duration/timing/severity/associated sxs/prior Treatment) Patient is a 69 y.o. male presenting with vomiting. The history is provided by the nursing home (pt has had some vomiting today).  Emesis Severity:  Mild Timing:  Intermittent Quality:  Undigested food Able to tolerate:  Liquids Progression:  Unchanged Associated symptoms: no abdominal pain, no diarrhea and no headaches     Past Medical History  Diagnosis Date  . Alzheimer disease   . Hypertension   . Dementia   . BPH (benign prostatic hyperplasia)   . HLD (hyperlipidemia)   . Normocytic anemia   . Parkinson disease   . Bladder calculus 06/29/2014   Past Surgical History  Procedure Laterality Date  . Rectal surgery     Family History  Problem Relation Age of Onset  . Alzheimer's disease Father    History  Substance Use Topics  . Smoking status: Former Games developermoker  . Smokeless tobacco: Not on file  . Alcohol Use: No    Review of Systems  Constitutional: Negative for appetite change and fatigue.  HENT: Negative for congestion, ear discharge and sinus pressure.   Eyes: Negative for discharge.  Respiratory: Negative for cough.   Cardiovascular: Negative for chest pain.  Gastrointestinal: Positive for vomiting. Negative for abdominal pain and diarrhea.  Genitourinary: Negative for frequency and hematuria.  Musculoskeletal: Negative for back pain.  Skin: Negative for rash.  Neurological: Negative for seizures and headaches.  Psychiatric/Behavioral: Negative for hallucinations.      Allergies  Review of patient's allergies indicates no known allergies.  Home Medications   Prior to Admission medications   Medication Sig Start Date End Date Taking? Authorizing Provider  acetaminophen (TYLENOL) 500  MG tablet Take 500 mg by mouth every 4 (four) hours as needed. For fever 99.5 to 101. Monitor temperature 3 times daily until normal above 101.    Historical Provider, MD  acidophilus (RISAQUAD) CAPS capsule Take 1 capsule by mouth daily.    Historical Provider, MD  ALPRAZolam Prudy Feeler(XANAX) 0.25 MG tablet Take 0.25 mg by mouth every 8 (eight) hours as needed for anxiety.    Historical Provider, MD  alum & mag hydroxide-simeth (GERI-LANTA) 200-200-20 MG/5ML suspension Take 30 mLs by mouth daily as needed for indigestion or heartburn.    Historical Provider, MD  aspirin 81 MG chewable tablet Chew 81 mg by mouth daily.    Historical Provider, MD  B Complex-Biotin-FA (B-COMPLEX PO) Take 1 tablet by mouth daily.    Historical Provider, MD  busPIRone (BUSPAR) 15 MG tablet Take 15 mg by mouth 2 (two) times daily.    Historical Provider, MD  carbidopa-levodopa (SINEMET IR) 25-100 MG per tablet Take 1 tablet by mouth 2 (two) times daily.     Historical Provider, MD  cetirizine (ZYRTEC) 10 MG tablet Take 10 mg by mouth daily as needed for allergies.    Historical Provider, MD  ciprofloxacin (CIPRO) 500 MG tablet Take 1 tablet (500 mg total) by mouth 2 (two) times daily. 06/30/14   Hollice EspySendil K Krishnan, MD  diphenhydrAMINE (BENADRYL) 25 MG tablet Take 2 tablets (50 mg total) by mouth every 4 (four) hours as needed for itching (not relieved by zyrtec). 05/15/14   Ward GivensIva L Knapp, MD  Emollient (CETAPHIL) cream Apply topically as needed. Patient taking differently:  Apply 1 application topically daily.  05/29/14   Gerhard Munch, MD  famotidine (PEPCID) 20 MG tablet Take 1 tablet (20 mg total) by mouth 2 (two) times daily. Patient not taking: Reported on 06/29/2014 05/15/14   Ward Givens, MD  galantamine (RAZADYNE) 4 MG tablet Take 4 mg by mouth 2 (two) times daily.    Historical Provider, MD  guaiFENesin (Q-TUSSIN) 100 MG/5ML liquid Take 200 mg by mouth every 6 (six) hours as needed for cough.    Historical Provider, MD   Infant Care Products (DERMACLOUD) CREA Apply 1 application topically 3 (three) times daily as needed (rash).    Historical Provider, MD  loperamide (IMODIUM) 2 MG capsule Take 2 mg by mouth as needed for diarrhea or loose stools.    Historical Provider, MD  magnesium hydroxide (MILK OF MAGNESIA) 400 MG/5ML suspension Take 30 mLs by mouth at bedtime as needed for mild constipation or moderate constipation.    Historical Provider, MD  metroNIDAZOLE (FLAGYL) 500 MG tablet Take 1 tablet (500 mg total) by mouth 3 (three) times daily. 06/30/14   Hollice Espy, MD  mirtazapine (REMERON) 15 MG tablet Take 15 mg by mouth at bedtime.    Historical Provider, MD  Olopatadine HCl (PATADAY) 0.2 % SOLN Apply 1 drop to eye daily as needed (for runny/watery eyes).    Historical Provider, MD  Omega-3 Fatty Acids (FISH OIL) 1200 MG CAPS Take 1 capsule by mouth daily.    Historical Provider, MD  pantoprazole (PROTONIX) 40 MG tablet Take 1 tablet (40 mg total) by mouth daily at 12 noon. Patient not taking: Reported on 05/15/2014 12/17/13   Erick Blinks, MD  polyethylene glycol (MIRALAX / GLYCOLAX) packet Take 17 g by mouth daily as needed for mild constipation or moderate constipation.    Historical Provider, MD  prazosin (MINIPRESS) 2 MG capsule Take 2 mg by mouth every 6 (six) hours as needed (anxiety).     Historical Provider, MD  QUEtiapine (SEROQUEL) 25 MG tablet Take 25 mg by mouth at bedtime.    Historical Provider, MD  selenium sulfide (ANTI-DANDRUFF) 1 % LOTN Apply 1 application topically 3 (three) times a week.    Historical Provider, MD  sertraline (ZOLOFT) 50 MG tablet Take 50 mg by mouth every other day.     Historical Provider, MD  vitamin C (ASCORBIC ACID) 500 MG tablet Take 500 mg by mouth daily.    Historical Provider, MD  Vitamin D, Ergocalciferol, (DRISDOL) 50000 UNITS CAPS capsule Take 50,000 Units by mouth every 7 (seven) days. Takes on Saturdays.    Historical Provider, MD  vitamin E 400 UNIT  capsule Take 400 Units by mouth daily.    Historical Provider, MD   BP 118/56 mmHg  Pulse 53  Temp(Src) 97.6 F (36.4 C) (Oral)  Resp 18  SpO2 96% Physical Exam  Constitutional: He appears well-developed.  HENT:  Head: Normocephalic.  Eyes: Conjunctivae and EOM are normal. No scleral icterus.  Neck: Neck supple. No thyromegaly present.  Cardiovascular: Normal rate and regular rhythm.  Exam reveals no gallop and no friction rub.   No murmur heard. Pulmonary/Chest: No stridor. He has no wheezes. He has no rales. He exhibits no tenderness.  Abdominal: He exhibits no distension. There is no tenderness. There is no rebound.  Musculoskeletal: Normal range of motion. He exhibits no edema.  Lymphadenopathy:    He has no cervical adenopathy.  Neurological: He exhibits normal muscle tone. Coordination normal.  Alert oriented to person  only  Skin: No rash noted. No erythema.    ED Course  Procedures (including critical care time) Labs Review Labs Reviewed  CBC WITH DIFFERENTIAL  COMPREHENSIVE METABOLIC PANEL    Imaging Review Dg Abd Acute W/chest  07/08/2014   CLINICAL DATA:  69 year old male with vomiting.  EXAM: ACUTE ABDOMEN SERIES (ABDOMEN 2 VIEW & CHEST 1 VIEW)  COMPARISON:  Abdominal radiograph 06/30/2014.  FINDINGS: Ill-defined retrocardiac opacity in the left base. Lungs are otherwise clear. No pleural effusions. No evidence of pulmonary edema. Heart size is borderline enlarged. Upper mediastinal contours are within normal limits. Atherosclerosis in the thoracic aorta.  Gas and stool are seen scattered throughout the colon extending to the level of the distal rectum. No pathologic distension of small bowel is noted. No gross evidence of pneumoperitoneum. 17 mm calculus projecting over the urinary bladder again noted.  IMPRESSION: 1. Nonobstructive bowel gas pattern. 2. No pneumoperitoneum. 3. Ill-defined left lower lobe opacity may reflect sequela of mild aspiration or developing  bronchopneumonia. Clinical correlation is recommended. 4. 17 mm bladder calculus noted.   Electronically Signed   By: Trudie Reed M.D.   On: 07/08/2014 20:22     EKG Interpretation None      MDM   Final diagnoses:  Vomiting    Vomiting,  Normal studies,   zofran and follow up with pcp in 2 days    Benny Lennert, MD 07/09/14 1344

## 2014-07-08 NOTE — ED Notes (Signed)
[facility] and generalized weakness for 2 days.  Pt given zofran 4 mg iv per ems en route.

## 2014-07-08 NOTE — Discharge Instructions (Signed)
Liquids only for 2 days then advance solid foods.    Follow up with your md next week.

## 2014-07-08 NOTE — ED Notes (Signed)
Patient dispensed  zofran 4mg  odt and explained and given to wife per md order

## 2014-09-09 ENCOUNTER — Encounter (HOSPITAL_COMMUNITY): Payer: Self-pay | Admitting: Emergency Medicine

## 2014-09-09 ENCOUNTER — Emergency Department (HOSPITAL_COMMUNITY): Payer: Medicare Other

## 2014-09-09 ENCOUNTER — Emergency Department (HOSPITAL_COMMUNITY)
Admission: EM | Admit: 2014-09-09 | Discharge: 2014-09-10 | Disposition: A | Discharge status: Home / Self Care | Payer: Medicare Other | Attending: Emergency Medicine | Admitting: Emergency Medicine

## 2014-09-09 DIAGNOSIS — S0181XA Laceration without foreign body of other part of head, initial encounter: Secondary | ICD-10-CM | POA: Diagnosis not present

## 2014-09-09 DIAGNOSIS — G309 Alzheimer's disease, unspecified: Secondary | ICD-10-CM | POA: Insufficient documentation

## 2014-09-09 DIAGNOSIS — S0990XA Unspecified injury of head, initial encounter: Secondary | ICD-10-CM | POA: Insufficient documentation

## 2014-09-09 DIAGNOSIS — Z7982 Long term (current) use of aspirin: Secondary | ICD-10-CM | POA: Insufficient documentation

## 2014-09-09 DIAGNOSIS — Z8639 Personal history of other endocrine, nutritional and metabolic disease: Secondary | ICD-10-CM | POA: Diagnosis not present

## 2014-09-09 DIAGNOSIS — Z792 Long term (current) use of antibiotics: Secondary | ICD-10-CM | POA: Insufficient documentation

## 2014-09-09 DIAGNOSIS — I10 Essential (primary) hypertension: Secondary | ICD-10-CM | POA: Insufficient documentation

## 2014-09-09 DIAGNOSIS — Z7951 Long term (current) use of inhaled steroids: Secondary | ICD-10-CM | POA: Insufficient documentation

## 2014-09-09 DIAGNOSIS — Z862 Personal history of diseases of the blood and blood-forming organs and certain disorders involving the immune mechanism: Secondary | ICD-10-CM | POA: Insufficient documentation

## 2014-09-09 DIAGNOSIS — S022XXA Fracture of nasal bones, initial encounter for closed fracture: Secondary | ICD-10-CM | POA: Insufficient documentation

## 2014-09-09 DIAGNOSIS — F0280 Dementia in other diseases classified elsewhere without behavioral disturbance: Secondary | ICD-10-CM | POA: Diagnosis not present

## 2014-09-09 DIAGNOSIS — Z7952 Long term (current) use of systemic steroids: Secondary | ICD-10-CM | POA: Insufficient documentation

## 2014-09-09 DIAGNOSIS — Z87448 Personal history of other diseases of urinary system: Secondary | ICD-10-CM | POA: Insufficient documentation

## 2014-09-09 DIAGNOSIS — Y9289 Other specified places as the place of occurrence of the external cause: Secondary | ICD-10-CM | POA: Insufficient documentation

## 2014-09-09 DIAGNOSIS — G3183 Dementia with Lewy bodies: Secondary | ICD-10-CM | POA: Diagnosis not present

## 2014-09-09 DIAGNOSIS — W01198A Fall on same level from slipping, tripping and stumbling with subsequent striking against other object, initial encounter: Secondary | ICD-10-CM | POA: Insufficient documentation

## 2014-09-09 DIAGNOSIS — Y998 Other external cause status: Secondary | ICD-10-CM | POA: Insufficient documentation

## 2014-09-09 DIAGNOSIS — Z87891 Personal history of nicotine dependence: Secondary | ICD-10-CM | POA: Diagnosis not present

## 2014-09-09 DIAGNOSIS — Y9301 Activity, walking, marching and hiking: Secondary | ICD-10-CM | POA: Diagnosis not present

## 2014-09-09 DIAGNOSIS — R040 Epistaxis: Secondary | ICD-10-CM

## 2014-09-09 LAB — CBG MONITORING, ED: Glucose-Capillary: 117 mg/dL — ABNORMAL HIGH (ref 70–99)

## 2014-09-09 MED ORDER — ACETAMINOPHEN 500 MG PO TABS
1000.0000 mg | ORAL_TABLET | Freq: Once | ORAL | Status: AC
  Administered 2014-09-09: 1000 mg via ORAL
  Filled 2014-09-09: qty 2

## 2014-09-09 NOTE — Discharge Instructions (Signed)
Keep wounds clean. If you were given medicines take as directed.  If you are on coumadin or contraceptives realize their levels and effectiveness is altered by many different medicines.  If you have any reaction (rash, tongues swelling, other) to the medicines stop taking and see a physician.   Please follow up as directed and return to the ER or see a physician for new or worsening symptoms.  Thank you. Filed Vitals:   09/09/14 2033  BP: 130/50  Pulse: 84  Temp: 99.1 F (37.3 C)  TempSrc: Oral  Resp: 22  SpO2: 94%

## 2014-09-09 NOTE — ED Provider Notes (Signed)
CSN: [id number]     Arrival date & time 09/09/14  2028 History  This chart was scribed for Blane Ohara, MD by Tonye Royalty, ED Scribe. This patient was seen in room APA06/APA06 and the patient's care was started at 8:39 PM.   LEVEL 5 CAVEAT: dementia   Chief Complaint  Patient presents with  . Fall  . Facial Laceration   Patient is a 69 y.o. male presenting with fall. The history is provided by the patient, the EMS personnel and the spouse. The history is limited by the condition of the patient. No language interpreter was used.  Fall This is a new problem. The current episode started less than 1 hour ago. Episode frequency: once. The problem has not changed since onset.He has tried nothing for the symptoms.    HPI Comments: Evan Lozano is a 69 y.o. male wit history of Alzheimer disease, dementia, and Parkinson disease who presents to the Emergency Department complaining of facial injury. Per EMS, he walked into a door then fell, striking his face the handrail. He lives at [facility]. He has baseline dementia and shaking and normally can speak only a few words. Wife states this is the first time in a while that he has fallen.  Past Medical History  Diagnosis Date  . Alzheimer disease   . Hypertension   . Dementia   . BPH (benign prostatic hyperplasia)   . HLD (hyperlipidemia)   . Normocytic anemia   . Parkinson disease   . Bladder calculus 06/29/2014   Past Surgical History  Procedure Laterality Date  . Rectal surgery     Family History  Problem Relation Age of Onset  . Alzheimer's disease Father    History  Substance Use Topics  . Smoking status: Former Games developer  . Smokeless tobacco: Not on file  . Alcohol Use: No    Review of Systems  Unable to perform ROS: Dementia  HENT: Positive for nosebleeds.   Skin: Positive for wound.      Allergies  Review of patient's allergies indicates no known allergies.  Home Medications   Prior to Admission  medications   Medication Sig Start Date End Date Taking? Authorizing Provider  acidophilus (RISAQUAD) CAPS capsule Take 1 capsule by mouth daily.   Yes Historical Provider, MD  ALPRAZolam (XANAX) 0.25 MG tablet Take 0.25 mg by mouth every 8 (eight) hours as needed for anxiety.   Yes Historical Provider, MD  aspirin 81 MG chewable tablet Chew 81 mg by mouth daily.   Yes Historical Provider, MD  b complex vitamins tablet Take 1 tablet by mouth daily.   Yes Historical Provider, MD  betamethasone dipropionate (DIPROLENE) 0.05 % cream Apply 1 application topically 2 (two) times daily.   Yes Historical Provider, MD  busPIRone (BUSPAR) 15 MG tablet Take 15 mg by mouth 2 (two) times daily.   Yes Historical Provider, MD  cetirizine (ZYRTEC) 10 MG tablet Take 10 mg by mouth daily as needed for allergies.   Yes Historical Provider, MD  Emollient (CETAPHIL) cream Apply topically as needed. Patient taking differently: Apply 1 application topically 2 (two) times daily.  05/29/14  Yes Gerhard Munch, MD  Emollient Hinton Dyer) LOTN Apply 1 application topically daily.   Yes Historical Provider, MD  galantamine (RAZADYNE) 4 MG tablet Take 4 mg by mouth 2 (two) times daily.   Yes Historical Provider, MD  Infant Care Products (DERMACLOUD) CREA Apply 1 application topically 3 (three) times daily as needed (rash).  Yes Historical Provider, MD  mirtazapine (REMERON) 15 MG tablet Take 15 mg by mouth at bedtime.   Yes Historical Provider, MD  Omega-3 Fatty Acids (FISH OIL) 1200 MG CAPS Take 1 capsule by mouth daily.   Yes Historical Provider, MD  prazosin (MINIPRESS) 2 MG capsule Take 2 mg by mouth 2 (two) times daily.    Yes Historical Provider, MD  QUEtiapine (SEROQUEL) 25 MG tablet Take 25 mg by mouth daily.    Yes Historical Provider, MD  selenium sulfide (ANTI-DANDRUFF) 1 % LOTN Apply 1 application topically 3 (three) times a week.   Yes Historical Provider, MD  sertraline (ZOLOFT) 50 MG tablet Take 50 mg by mouth  every other day.    Yes Historical Provider, MD  triamcinolone cream (KENALOG) 0.1 % Apply 1 application topically 2 (two) times daily.   Yes Historical Provider, MD  vitamin C (ASCORBIC ACID) 500 MG tablet Take 500 mg by mouth daily.   Yes Historical Provider, MD  vitamin E 200 UNIT capsule Take 200 Units by mouth daily.   Yes Historical Provider, MD  acetaminophen (TYLENOL) 500 MG tablet Take 500 mg by mouth every 4 (four) hours as needed. For fever 99.5 to 101. Monitor temperature 3 times daily until normal above 101.    Historical Provider, MD  alum & mag hydroxide-simeth (GERI-LANTA) 200-200-20 MG/5ML suspension Take 30 mLs by mouth daily as needed for indigestion or heartburn.    Historical Provider, MD  ciprofloxacin (CIPRO) 500 MG tablet Take 1 tablet (500 mg total) by mouth 2 (two) times daily. Patient not taking: Reported on 09/09/2014 06/30/14   Hollice Espy, MD  diphenhydrAMINE (BENADRYL) 25 MG tablet Take 2 tablets (50 mg total) by mouth every 4 (four) hours as needed for itching (not relieved by zyrtec). 05/15/14   Devoria Albe, MD  famotidine (PEPCID) 20 MG tablet Take 1 tablet (20 mg total) by mouth 2 (two) times daily. Patient not taking: Reported on 06/29/2014 05/15/14   Devoria Albe, MD  guaiFENesin (Q-TUSSIN) 100 MG/5ML liquid Take 200 mg by mouth every 6 (six) hours as needed for cough.    Historical Provider, MD  loperamide (IMODIUM) 2 MG capsule Take 2 mg by mouth as needed for diarrhea or loose stools.    Historical Provider, MD  magnesium hydroxide (MILK OF MAGNESIA) 400 MG/5ML suspension Take 30 mLs by mouth at bedtime as needed for mild constipation or moderate constipation.    Historical Provider, MD  metroNIDAZOLE (FLAGYL) 500 MG tablet Take 1 tablet (500 mg total) by mouth 3 (three) times daily. Patient not taking: Reported on 09/09/2014 06/30/14   Hollice Espy, MD  Olopatadine HCl (PATADAY) 0.2 % SOLN Apply 1 drop to eye daily as needed (for runny/watery eyes).     Historical Provider, MD  ondansetron (ZOFRAN ODT) 4 MG disintegrating tablet 4mg  ODT q4 hours prn nausea/vomit 07/08/14   Bethann Berkshire, MD  pantoprazole (PROTONIX) 40 MG tablet Take 1 tablet (40 mg total) by mouth daily at 12 noon. Patient not taking: Reported on 05/15/2014 12/17/13   Erick Blinks, MD  polyethylene glycol (MIRALAX / GLYCOLAX) packet Take 17 g by mouth daily as needed for mild constipation or moderate constipation.    Historical Provider, MD  Vitamin D, Ergocalciferol, (DRISDOL) 50000 UNITS CAPS capsule Take 50,000 Units by mouth every 7 (seven) days. Takes on Saturdays.    Historical Provider, MD   BP 130/50 mmHg  Pulse 84  Temp(Src) 99.1 F (37.3 C) (Oral)  Resp  22  SpO2 94% Physical Exam  Constitutional: He appears well-developed and well-nourished.  HENT:  Head: Normocephalic.  Traumatic injuries to the face blood from both nares Swelling to nasal bridge No obvious stepoff in facial bones Missing teeth chronic, pulls out his own teeth per wife  Eyes: Conjunctivae are normal. Pupils are equal, round, and reactive to light.  Neck: Normal range of motion. Neck supple.  No signs of tenderness to papation of neck  Pulmonary/Chest: Effort normal.  Abdominal:  No bruising on abdomen  Musculoskeletal: Normal range of motion.  No evidence of tenderness or effusion in wrists, elbows, or shoulders bilaterally No signs of tenderness or effusions in knees, ankles, or hips bilaterally  Neurological: He is alert. GCS eye subscore is 4. GCS verbal subscore is 4. GCS motor subscore is 5.  Tremor in arms Confused/ dementia/ parkinson facies  Perrl, horiz eomfi Moves all ext equal bilateral UE and LE  Skin: Skin is warm and dry.  Nursing note and vitals reviewed.   ED Course  Procedures (including critical care time) LACERATION REPAIR Performed by: Enid Skeens Authorized by: Enid Skeens  Wound explored  Laceration Location: right face Laceration Length:  1.5cm No Foreign Bodies seen or palpated Dermabond  Skin closure: approximated  Patient tolerance: Patient tolerated the procedure well with no immediate complications.   DIAGNOSTIC STUDIES: Oxygen Saturation is 94% on room air, adequate by my interpretation.    COORDINATION OF CARE: 8:43 PM Discussed treatment plan with patient at beside, the patient agrees with the plan and has no further questions at this time.   Labs Review Labs Reviewed  CBG MONITORING, ED - Abnormal; Notable for the following:    Glucose-Capillary 117 (*)    All other components within normal limits    Imaging Review Dg Pelvis 1-2 Views  09/09/2014   CLINICAL DATA:  Post fall earlier this day at nursing home.  EXAM: PELVIS - 1-2 VIEW  COMPARISON:  None.  FINDINGS: The cortical margins of the bony pelvis are intact. Mild patient rotation partially limiting evaluation of the left pelvis. Allowing for this, no fracture. Pubic symphysis and sacroiliac joints are congruent. Both femoral heads are well-seated in the respective acetabula. Bladder calculus again noted.  IMPRESSION: No pelvic fracture.   Electronically Signed   By: Rubye Oaks M.D.   On: 09/09/2014 20:52   Ct Head Wo Contrast  09/09/2014   CLINICAL DATA:  Walked into door, struck face on handrail at nursing home. History of Alzheimer's, Parkinson's and dementia. Evaluate acute head injury.  EXAM: CT HEAD WITHOUT CONTRAST  CT MAXILLOFACIAL WITHOUT CONTRAST  CT CERVICAL SPINE WITHOUT CONTRAST  TECHNIQUE: Multidetector CT imaging of the head, cervical spine, and maxillofacial structures were performed using the standard protocol without intravenous contrast. Multiplanar CT image reconstructions of the cervical spine and maxillofacial structures were also generated.  COMPARISON:  CT of the head, face and cervical spine August 14, 2013  FINDINGS: CT HEAD FINDINGS  Moderate to severe ventriculomegaly, likely on the basis of global parenchymal brain volume  loss as there is overall commensurate enlargement of cerebral sulci and cerebellar folia. No intraparenchymal hemorrhage, mass effect, midline shift or acute large vascular territory infarcts. Patchy supratentorial white matter hypodensities, predominately bifrontal, are unchanged.  No abnormal extra-axial fluid collections. Basal cisterns are patent. No skull fracture.  CT MAXILLOFACIAL FINDINGS  Moderately motion degraded evaluation of the mandible.  Mandible appears intact, condyles are located. Mildly comminuted nasal bone fracture, slightly displaced  to the LEFT is new from prior imaging. Fracture extends to the nasal maxillary suture. No additional facial fractures.  Mild paranasal sinus mucosal thickening without air-fluid levels. Nasal septum deviated to the RIGHT with small bony spur.  Ocular globes and orbital contents are unremarkable. Soft tissues are nonsuspicious. Soft tissue within the RIGHT external auditory canal likely reflects cerumen. Multiple absent teeth, LEFT maxillary periapical lucency is relatively unchanged.  CT CERVICAL SPINE FINDINGS  Cervical vertebral bodies and posterior elements are intact and aligned, maintenance of cervical lordosis. Severe C4-5 through C6-7 disc height loss, vacuum disc at C4-5, uncovertebral hypertrophy consistent with degenerative discs resulting in severe RIGHT C4-5, C5-6 neural foraminal narrowing. C1-2 articulation with moderate arthropathy. No destructive bony lesions.  Mild calcific atherosclerosis with carotid bulbs. Nuchal ligament calcification evident.  IMPRESSION: CT HEAD: No acute intracranial process.  Moderate to severe global brain atrophy. Mild white matter changes suggest chronic small vessel ischemic disease.  CT MAXILLOFACIAL: Acute appearing minimally displaced LEFT nasal bone fracture.  Moderate motion through the mandible, if there are referable symptoms, repeat imaging could be performed.  CT CERVICAL SPINE: Degenerative change of the  cervical spine without acute fracture nor malalignment.   Electronically Signed   By: Awilda Metroourtnay  Bloomer   On: 09/09/2014 20:58   Ct Cervical Spine Wo Contrast  09/09/2014   CLINICAL DATA:  Walked into door, struck face on handrail at nursing home. History of Alzheimer's, Parkinson's and dementia. Evaluate acute head injury.  EXAM: CT HEAD WITHOUT CONTRAST  CT MAXILLOFACIAL WITHOUT CONTRAST  CT CERVICAL SPINE WITHOUT CONTRAST  TECHNIQUE: Multidetector CT imaging of the head, cervical spine, and maxillofacial structures were performed using the standard protocol without intravenous contrast. Multiplanar CT image reconstructions of the cervical spine and maxillofacial structures were also generated.  COMPARISON:  CT of the head, face and cervical spine August 14, 2013  FINDINGS: CT HEAD FINDINGS  Moderate to severe ventriculomegaly, likely on the basis of global parenchymal brain volume loss as there is overall commensurate enlargement of cerebral sulci and cerebellar folia. No intraparenchymal hemorrhage, mass effect, midline shift or acute large vascular territory infarcts. Patchy supratentorial white matter hypodensities, predominately bifrontal, are unchanged.  No abnormal extra-axial fluid collections. Basal cisterns are patent. No skull fracture.  CT MAXILLOFACIAL FINDINGS  Moderately motion degraded evaluation of the mandible.  Mandible appears intact, condyles are located. Mildly comminuted nasal bone fracture, slightly displaced to the LEFT is new from prior imaging. Fracture extends to the nasal maxillary suture. No additional facial fractures.  Mild paranasal sinus mucosal thickening without air-fluid levels. Nasal septum deviated to the RIGHT with small bony spur.  Ocular globes and orbital contents are unremarkable. Soft tissues are nonsuspicious. Soft tissue within the RIGHT external auditory canal likely reflects cerumen. Multiple absent teeth, LEFT maxillary periapical lucency is relatively  unchanged.  CT CERVICAL SPINE FINDINGS  Cervical vertebral bodies and posterior elements are intact and aligned, maintenance of cervical lordosis. Severe C4-5 through C6-7 disc height loss, vacuum disc at C4-5, uncovertebral hypertrophy consistent with degenerative discs resulting in severe RIGHT C4-5, C5-6 neural foraminal narrowing. C1-2 articulation with moderate arthropathy. No destructive bony lesions.  Mild calcific atherosclerosis with carotid bulbs. Nuchal ligament calcification evident.  IMPRESSION: CT HEAD: No acute intracranial process.  Moderate to severe global brain atrophy. Mild white matter changes suggest chronic small vessel ischemic disease.  CT MAXILLOFACIAL: Acute appearing minimally displaced LEFT nasal bone fracture.  Moderate motion through the mandible, if there are referable symptoms, repeat  imaging could be performed.  CT CERVICAL SPINE: Degenerative change of the cervical spine without acute fracture nor malalignment.   Electronically Signed   By: Awilda Metro   On: 09/09/2014 20:58   Ct Maxillofacial Wo Cm  09/09/2014   CLINICAL DATA:  Walked into door, struck face on handrail at nursing home. History of Alzheimer's, Parkinson's and dementia. Evaluate acute head injury.  EXAM: CT HEAD WITHOUT CONTRAST  CT MAXILLOFACIAL WITHOUT CONTRAST  CT CERVICAL SPINE WITHOUT CONTRAST  TECHNIQUE: Multidetector CT imaging of the head, cervical spine, and maxillofacial structures were performed using the standard protocol without intravenous contrast. Multiplanar CT image reconstructions of the cervical spine and maxillofacial structures were also generated.  COMPARISON:  CT of the head, face and cervical spine August 14, 2013  FINDINGS: CT HEAD FINDINGS  Moderate to severe ventriculomegaly, likely on the basis of global parenchymal brain volume loss as there is overall commensurate enlargement of cerebral sulci and cerebellar folia. No intraparenchymal hemorrhage, mass effect, midline shift  or acute large vascular territory infarcts. Patchy supratentorial white matter hypodensities, predominately bifrontal, are unchanged.  No abnormal extra-axial fluid collections. Basal cisterns are patent. No skull fracture.  CT MAXILLOFACIAL FINDINGS  Moderately motion degraded evaluation of the mandible.  Mandible appears intact, condyles are located. Mildly comminuted nasal bone fracture, slightly displaced to the LEFT is new from prior imaging. Fracture extends to the nasal maxillary suture. No additional facial fractures.  Mild paranasal sinus mucosal thickening without air-fluid levels. Nasal septum deviated to the RIGHT with small bony spur.  Ocular globes and orbital contents are unremarkable. Soft tissues are nonsuspicious. Soft tissue within the RIGHT external auditory canal likely reflects cerumen. Multiple absent teeth, LEFT maxillary periapical lucency is relatively unchanged.  CT CERVICAL SPINE FINDINGS  Cervical vertebral bodies and posterior elements are intact and aligned, maintenance of cervical lordosis. Severe C4-5 through C6-7 disc height loss, vacuum disc at C4-5, uncovertebral hypertrophy consistent with degenerative discs resulting in severe RIGHT C4-5, C5-6 neural foraminal narrowing. C1-2 articulation with moderate arthropathy. No destructive bony lesions.  Mild calcific atherosclerosis with carotid bulbs. Nuchal ligament calcification evident.  IMPRESSION: CT HEAD: No acute intracranial process.  Moderate to severe global brain atrophy. Mild white matter changes suggest chronic small vessel ischemic disease.  CT MAXILLOFACIAL: Acute appearing minimally displaced LEFT nasal bone fracture.  Moderate motion through the mandible, if there are referable symptoms, repeat imaging could be performed.  CT CERVICAL SPINE: Degenerative change of the cervical spine without acute fracture nor malalignment.   Electronically Signed   By: Awilda Metro   On: 09/09/2014 20:58     EKG  Interpretation None      MDM   Final diagnoses:  Acute head injury  Facial laceration, initial encounter  Epistaxis  Nasal bone fracture, closed, initial encounter   I personally performed the services described in this documentation, which was scribed in my presence. The recorded information has been reviewed and is accurate.  Patient with dementia and Parkinson's in fall history presents after witnessed fall. Patient at baseline per report and per wife who arrived later. Majority of injury clinically facial. Plan for CT head neck and face for further delineation of injury especially with dementia difficulty obtaining detailed neuro exam.  CT scan results reviewed left nasal bone fracture no other acute abnormalities. Dermabond for small right facial laceration. .   Medications  acetaminophen (TYLENOL) tablet 1,000 mg (1,000 mg Oral Given 09/09/14 2059)    Filed Vitals:  09/09/14 2033  BP: 130/50  Pulse: 84  Temp: 99.1 F (37.3 C)  TempSrc: Oral  Resp: 22  SpO2: 94%    Final diagnoses:  Acute head injury  Facial laceration, initial encounter  Epistaxis  Nasal bone fracture, closed, initial encounter      Blane Ohara, MD 09/09/14 2205

## 2014-09-09 NOTE — ED Notes (Signed)
Per EMS: pt walked into door and then fell into handrail. Bleeding noted to both nostrils.

## 2014-09-09 NOTE — ED Notes (Signed)
Report given to [place], Merchandiser, retailsupervisor at [facility]

## 2014-09-10 DIAGNOSIS — S0990XA Unspecified injury of head, initial encounter: Secondary | ICD-10-CM | POA: Diagnosis not present

## 2014-09-10 NOTE — ED Notes (Signed)
[facility].

## 2014-10-15 ENCOUNTER — Encounter (HOSPITAL_COMMUNITY): Payer: Self-pay

## 2014-10-15 ENCOUNTER — Emergency Department (HOSPITAL_COMMUNITY): Payer: Medicare Other

## 2014-10-15 ENCOUNTER — Emergency Department (HOSPITAL_COMMUNITY)
Admission: EM | Admit: 2014-10-15 | Discharge: 2014-10-15 | Disposition: A | Discharge status: Home / Self Care | Payer: Medicare Other | Attending: Emergency Medicine | Admitting: Emergency Medicine

## 2014-10-15 DIAGNOSIS — Z87891 Personal history of nicotine dependence: Secondary | ICD-10-CM | POA: Insufficient documentation

## 2014-10-15 DIAGNOSIS — Z862 Personal history of diseases of the blood and blood-forming organs and certain disorders involving the immune mechanism: Secondary | ICD-10-CM | POA: Diagnosis not present

## 2014-10-15 DIAGNOSIS — S50311A Abrasion of right elbow, initial encounter: Secondary | ICD-10-CM | POA: Insufficient documentation

## 2014-10-15 DIAGNOSIS — S0990XA Unspecified injury of head, initial encounter: Secondary | ICD-10-CM | POA: Insufficient documentation

## 2014-10-15 DIAGNOSIS — Z7982 Long term (current) use of aspirin: Secondary | ICD-10-CM | POA: Diagnosis not present

## 2014-10-15 DIAGNOSIS — Y9289 Other specified places as the place of occurrence of the external cause: Secondary | ICD-10-CM | POA: Insufficient documentation

## 2014-10-15 DIAGNOSIS — F0280 Dementia in other diseases classified elsewhere without behavioral disturbance: Secondary | ICD-10-CM | POA: Diagnosis not present

## 2014-10-15 DIAGNOSIS — Z87448 Personal history of other diseases of urinary system: Secondary | ICD-10-CM | POA: Diagnosis not present

## 2014-10-15 DIAGNOSIS — Z87438 Personal history of other diseases of male genital organs: Secondary | ICD-10-CM | POA: Diagnosis not present

## 2014-10-15 DIAGNOSIS — Y9389 Activity, other specified: Secondary | ICD-10-CM | POA: Insufficient documentation

## 2014-10-15 DIAGNOSIS — Y998 Other external cause status: Secondary | ICD-10-CM | POA: Diagnosis not present

## 2014-10-15 DIAGNOSIS — W19XXXA Unspecified fall, initial encounter: Secondary | ICD-10-CM

## 2014-10-15 DIAGNOSIS — I10 Essential (primary) hypertension: Secondary | ICD-10-CM | POA: Diagnosis not present

## 2014-10-15 DIAGNOSIS — G20 Parkinson's disease: Secondary | ICD-10-CM | POA: Diagnosis not present

## 2014-10-15 DIAGNOSIS — Z79899 Other long term (current) drug therapy: Secondary | ICD-10-CM | POA: Diagnosis not present

## 2014-10-15 DIAGNOSIS — W01198A Fall on same level from slipping, tripping and stumbling with subsequent striking against other object, initial encounter: Secondary | ICD-10-CM | POA: Insufficient documentation

## 2014-10-15 DIAGNOSIS — G309 Alzheimer's disease, unspecified: Secondary | ICD-10-CM | POA: Insufficient documentation

## 2014-10-15 NOTE — ED Notes (Signed)
Wife to transport back to [facility]

## 2014-10-15 NOTE — Discharge Instructions (Signed)
Abrasion °An abrasion is a cut or scrape of the skin. Abrasions do not extend through all layers of the skin and most heal within 10 days. It is important to care for your abrasion properly to prevent infection. °CAUSES  °Most abrasions are caused by falling on, or gliding across, the ground or other surface. When your skin rubs on something, the outer and inner layer of skin rubs off, causing an abrasion. °DIAGNOSIS  °Your caregiver will be able to diagnose an abrasion during a physical exam.  °TREATMENT  °Your treatment depends on how large and deep the abrasion is. Generally, your abrasion will be cleaned with water and a mild soap to remove any dirt or debris. An antibiotic ointment may be put over the abrasion to prevent an infection. A bandage (dressing) may be wrapped around the abrasion to keep it from getting dirty.  °You may need a tetanus shot if: °· You cannot remember when you had your last tetanus shot. °· You have never had a tetanus shot. °· The injury broke your skin. °If you get a tetanus shot, your arm may swell, get red, and feel warm to the touch. This is common and not a problem. If you need a tetanus shot and you choose not to have one, there is a rare chance of getting tetanus. Sickness from tetanus can be serious.  °HOME CARE INSTRUCTIONS  °· If a dressing was applied, change it at least once a day or as directed by your caregiver. If the bandage sticks, soak it off with warm water.   °· Wash the area with water and a mild soap to remove all the ointment 2 times a day. Rinse off the soap and pat the area dry with a clean towel.   °· Reapply any ointment as directed by your caregiver. This will help prevent infection and keep the bandage from sticking. Use gauze over the wound and under the dressing to help keep the bandage from sticking.   °· Change your dressing right away if it becomes wet or dirty.   °· Only take over-the-counter or prescription medicines for pain, discomfort, or fever as  directed by your caregiver.   °· Follow up with your caregiver within 24-48 hours for a wound check, or as directed. If you were not given a wound-check appointment, look closely at your abrasion for redness, swelling, or pus. These are signs of infection. °SEEK IMMEDIATE MEDICAL CARE IF:  °· You have increasing pain in the wound.   °· You have redness, swelling, or tenderness around the wound.   °· You have pus coming from the wound.   °· You have a fever or persistent symptoms for more than 2-3 days. °· You have a fever and your symptoms suddenly get worse. °· You have a bad smell coming from the wound or dressing.   °MAKE SURE YOU:  °· Understand these instructions. °· Will watch your condition. °· Will get help right away if you are not doing well or get worse. °Document Released: 03/26/2005 Document Revised: 06/02/2012 Document Reviewed: 05/20/2011 °[employer]® Patient Information ©2015 [employer]. This information is not intended to replace advice given to you by your health care provider. Make sure you discuss any questions you have with your health care provider. ° ° °You have had a head injury which does not appear to require admission at this time. A concussion is a state of changed mental ability from trauma. ° °SEEK IMMEDIATE MEDICAL ATTENTION IF: °There is confusion or drowsiness (although children frequently become drowsy after   injury).  °You cannot awaken the injured person.  °There is nausea (feeling sick to your stomach) or continued, forceful vomiting.  °You notice dizziness or unsteadiness which is getting worse, or inability to walk.  °You have convulsions or unconsciousness.  °You experience severe, persistent headaches not relieved by Tylenol. (Do not take aspirin as this impairs clotting abilities). Take other pain medications only as directed.  °You cannot use arms or legs normally.  °There are changes in pupil sizes. (This is the black center in the colored part of the eye)  °There is clear  or bloody discharge from the nose or ears.  °Change in speech, vision, swallowing, or understanding.  °Localized weakness, numbness, tingling, or change in bowel or bladder control. ° °

## 2014-10-15 NOTE — ED Provider Notes (Signed)
CSN: [id number]     Arrival date & time    History  This chart was scribed for Zadie Rhine, MD by Jarvis Morgan, ED Scribe. This patient was seen in room APA14/APA14 and the patient's care was started at 12:22 AM.   Chief Complaint  Patient presents with  . Fall   Level 5 Caveat-- Dementia   Patient is a 69 y.o. male presenting with fall. The history is provided by the patient, the nursing home and the EMS personnel. The history is limited by the condition of the patient. No language interpreter was used.  Fall This is a new problem. The current episode started less than 1 hour ago. The problem occurs rarely. The problem has not changed since onset.   HPI Comments: Evan Lozano is a 69 y.o. male with a h/o HTN, Alzheimer's disease, dementia, HLD brought in by ambulance who presents to the Emergency Department due to a fall that occurred PTA. Pt is a resident of [address] Per EMS notes pt fell while walking and hit back of his head on the bed. He has an abrasion to the right elbow. There was no LOC associated with the fall.  Past Medical History  Diagnosis Date  . Alzheimer disease   . Hypertension   . Dementia   . BPH (benign prostatic hyperplasia)   . HLD (hyperlipidemia)   . Normocytic anemia   . Parkinson disease   . Bladder calculus 06/29/2014   Past Surgical History  Procedure Laterality Date  . Rectal surgery     Family History  Problem Relation Age of Onset  . Alzheimer's disease Father    History  Substance Use Topics  . Smoking status: Former Games developer  . Smokeless tobacco: Not on file  . Alcohol Use: No    Review of Systems  Unable to perform ROS: Dementia  Skin: Positive for wound (right elbow).      Allergies  Review of patient's allergies indicates no known allergies.  Home Medications   Prior to Admission medications   Medication Sig Start Date End Date Taking? Authorizing Provider  acetaminophen (TYLENOL) 500 MG tablet Take 500 mg by  mouth every 4 (four) hours as needed. For fever 99.5 to 101. Monitor temperature 3 times daily until normal above 101.   Yes Historical Provider, MD  acidophilus (RISAQUAD) CAPS capsule Take 1 capsule by mouth daily.   Yes Historical Provider, MD  ALPRAZolam (XANAX) 0.25 MG tablet Take 0.25 mg by mouth every 8 (eight) hours as needed for anxiety.   Yes Historical Provider, MD  alum & mag hydroxide-simeth (GERI-LANTA) 200-200-20 MG/5ML suspension Take 30 mLs by mouth daily as needed for indigestion or heartburn.   Yes Historical Provider, MD  aspirin 81 MG chewable tablet Chew 81 mg by mouth daily.   Yes Historical Provider, MD  b complex vitamins tablet Take 1 tablet by mouth daily.   Yes Historical Provider, MD  betamethasone dipropionate (DIPROLENE) 0.05 % cream Apply 1 application topically 2 (two) times daily.   Yes Historical Provider, MD  busPIRone (BUSPAR) 15 MG tablet Take 15 mg by mouth 2 (two) times daily.   Yes Historical Provider, MD  cetirizine (ZYRTEC) 10 MG tablet Take 10 mg by mouth daily as needed for allergies.   Yes Historical Provider, MD  diphenhydrAMINE (BENADRYL) 25 MG tablet Take 2 tablets (50 mg total) by mouth every 4 (four) hours as needed for itching (not relieved by zyrtec). 05/15/14  Yes Devoria Albe, MD  Emollient (  CETAPHIL) cream Apply topically as needed. Patient taking differently: Apply 1 application topically 2 (two) times daily.  05/29/14  Yes Gerhard Munchobert Lockwood, MD  Emollient Hinton Dyer(LUBRIDERM) LOTN Apply 1 application topically daily.   Yes Historical Provider, MD  famotidine (PEPCID) 20 MG tablet Take 1 tablet (20 mg total) by mouth 2 (two) times daily. 05/15/14  Yes Devoria AlbeIva Knapp, MD  galantamine (RAZADYNE) 4 MG tablet Take 4 mg by mouth 2 (two) times daily.   Yes Historical Provider, MD  guaiFENesin (Q-TUSSIN) 100 MG/5ML liquid Take 200 mg by mouth every 6 (six) hours as needed for cough.   Yes Historical Provider, MD  Infant Care Products (DERMACLOUD) CREA Apply 1  application topically 3 (three) times daily as needed (rash).   Yes Historical Provider, MD  loperamide (IMODIUM) 2 MG capsule Take 2 mg by mouth as needed for diarrhea or loose stools.   Yes Historical Provider, MD  magnesium hydroxide (MILK OF MAGNESIA) 400 MG/5ML suspension Take 30 mLs by mouth at bedtime as needed for mild constipation or moderate constipation.   Yes Historical Provider, MD  mirtazapine (REMERON) 15 MG tablet Take 15 mg by mouth at bedtime.   Yes Historical Provider, MD  Olopatadine HCl (PATADAY) 0.2 % SOLN Apply 1 drop to eye daily as needed (for runny/watery eyes).   Yes Historical Provider, MD  Omega-3 Fatty Acids (FISH OIL) 1200 MG CAPS Take 1 capsule by mouth daily.   Yes Historical Provider, MD  ondansetron (ZOFRAN ODT) 4 MG disintegrating tablet 4mg  ODT q4 hours prn nausea/vomit 07/08/14  Yes Bethann BerkshireJoseph Zammit, MD  pantoprazole (PROTONIX) 40 MG tablet Take 1 tablet (40 mg total) by mouth daily at 12 noon. 12/17/13  Yes Erick BlinksJehanzeb Memon, MD  polyethylene glycol (MIRALAX / GLYCOLAX) packet Take 17 g by mouth daily as needed for mild constipation or moderate constipation.   Yes Historical Provider, MD  prazosin (MINIPRESS) 2 MG capsule Take 2 mg by mouth 2 (two) times daily.    Yes Historical Provider, MD  QUEtiapine (SEROQUEL) 25 MG tablet Take 25 mg by mouth daily.    Yes Historical Provider, MD  selenium sulfide (ANTI-DANDRUFF) 1 % LOTN Apply 1 application topically 3 (three) times a week.   Yes Historical Provider, MD  sertraline (ZOLOFT) 50 MG tablet Take 50 mg by mouth every other day.    Yes Historical Provider, MD  triamcinolone cream (KENALOG) 0.1 % Apply 1 application topically 2 (two) times daily.   Yes Historical Provider, MD  vitamin C (ASCORBIC ACID) 500 MG tablet Take 500 mg by mouth daily.   Yes Historical Provider, MD  Vitamin D, Ergocalciferol, (DRISDOL) 50000 UNITS CAPS capsule Take 50,000 Units by mouth every 7 (seven) days. Takes on Saturdays.   Yes Historical  Provider, MD  vitamin E 200 UNIT capsule Take 200 Units by mouth daily.   Yes Historical Provider, MD  ciprofloxacin (CIPRO) 500 MG tablet Take 1 tablet (500 mg total) by mouth 2 (two) times daily. Patient not taking: Reported on 09/09/2014 06/30/14   Hollice EspySendil K Krishnan, MD  metroNIDAZOLE (FLAGYL) 500 MG tablet Take 1 tablet (500 mg total) by mouth 3 (three) times daily. Patient not taking: Reported on 09/09/2014 06/30/14   Hollice EspySendil K Krishnan, MD   Triage Vitals: BP 122/72 mmHg  Pulse 53  Temp(Src) 97.5 F (36.4 C) (Oral)  Resp 18  SpO2 98%   Physical Exam  Nursing note and vitals reviewed. CONSTITUTIONAL: Elderly and frail HEAD: Abrasion to scalp with no other signs of  trauma EYES: EOMI/PERRL ENMT: Mucous membranes moist NECK: supple no meningeal signs SPINE/BACK:no obvious tenderness to spine.  No bruising/crepitance/stepoffs noted to spine CV: S1/S2 noted, no murmurs/rubs/gallops noted LUNGS: Lungs are clear to auscultation bilaterally, no apparent distress ABDOMEN: soft, nontender, no rebound or guarding, bowel sounds noted throughout abdomen GU:no cva tenderness NEURO: Pt is awake/alert, moves all extremitiesx4.  He is nonverbal during exam EXTREMITIES: pulses normal/equal, full ROM, All extremities/joints palpated/ranged and nontender SKIN: warm, color normal. small Abrasions noted to right elbow   ED Course  Procedures   DIAGNOSTIC STUDIES: Oxygen Saturation is 98% on RA, normal by my interpretation.    COORDINATION OF CARE:  Pt from nursing facility s/p mechanical fall No evidence of spinal injury No head injury by CT imaging He has abrasion to right elbow (tdap UTD), bandage applied, xray negative Pt can stand without difficulty No other signs of trauma Appropriate for d/c Wife is here to take back to nursing facility   Imaging Review Dg Elbow Complete Right  10/15/2014   CLINICAL DATA:  Fall prior to arrival, now with right elbow pain.  EXAM: RIGHT ELBOW -  COMPLETE 3+ VIEW  COMPARISON:  None.  FINDINGS: No acute fracture or dislocation. Chronic change about the proximal radial ulnar articulation. Mild osteoarthritis of the ulnar humeral joint. No joint effusion.  IMPRESSION: Degenerative change of the right elbow without acute fracture or dislocation.   Electronically Signed   By: Rubye Oaks M.D.   On: 10/15/2014 01:17   Ct Head Wo Contrast  10/15/2014   CLINICAL DATA:  Head injury from fall.  Alzheimer's disease  EXAM: CT HEAD WITHOUT CONTRAST  TECHNIQUE: Contiguous axial images were obtained from the base of the skull through the vertex without intravenous contrast.  COMPARISON:  09/09/2014  FINDINGS: Skull and Sinuses:Negative for fracture or destructive process. The mastoids, middle ears, and imaged paranasal sinuses are clear.  Orbits: No acute abnormality.  Brain: No evidence of acute infarction, hemorrhage, hydrocephalus, or mass lesion/mass effect. Advanced cortical atrophy with relative sparing of the occipital lobes. There is related ventriculomegaly. Patchy bilateral cerebral white matter low density consistent with chronic small vessel ischemia.  IMPRESSION: 1. No evidence of intracranial injury. 2. Advanced atrophy.   Electronically Signed   By: Marnee Spring M.D.   On: 10/15/2014 01:11      MDM   Final diagnoses:  Fall, initial encounter  Minor head injury, initial encounter  Abrasion of right elbow, initial encounter    Nursing notes including past medical history and social history reviewed and considered in documentation   I personally performed the services described in this documentation, which was scribed in my presence. The recorded information has been reviewed and is accurate.       Zadie Rhine, MD 10/15/14 Earle Gell

## 2014-10-15 NOTE — ED Notes (Signed)
Fell while walking and hit the back of his head on the bed. Has a knot to the back of his head and a skin tear to right elbow. No loss of consciousness.

## 2014-11-25 ENCOUNTER — Emergency Department (HOSPITAL_COMMUNITY): Payer: Medicare Other

## 2014-11-25 ENCOUNTER — Emergency Department (HOSPITAL_COMMUNITY)
Admission: EM | Admit: 2014-11-25 | Discharge: 2014-11-25 | Disposition: A | Discharge status: Home / Self Care | Payer: Medicare Other | Attending: Emergency Medicine | Admitting: Emergency Medicine

## 2014-11-25 ENCOUNTER — Encounter (HOSPITAL_COMMUNITY): Payer: Self-pay | Admitting: Emergency Medicine

## 2014-11-25 DIAGNOSIS — Z7982 Long term (current) use of aspirin: Secondary | ICD-10-CM | POA: Insufficient documentation

## 2014-11-25 DIAGNOSIS — G309 Alzheimer's disease, unspecified: Secondary | ICD-10-CM | POA: Diagnosis not present

## 2014-11-25 DIAGNOSIS — G20 Parkinson's disease: Secondary | ICD-10-CM | POA: Insufficient documentation

## 2014-11-25 DIAGNOSIS — E785 Hyperlipidemia, unspecified: Secondary | ICD-10-CM | POA: Diagnosis not present

## 2014-11-25 DIAGNOSIS — Z87891 Personal history of nicotine dependence: Secondary | ICD-10-CM | POA: Diagnosis not present

## 2014-11-25 DIAGNOSIS — F0280 Dementia in other diseases classified elsewhere without behavioral disturbance: Secondary | ICD-10-CM | POA: Diagnosis not present

## 2014-11-25 DIAGNOSIS — Z79899 Other long term (current) drug therapy: Secondary | ICD-10-CM | POA: Insufficient documentation

## 2014-11-25 DIAGNOSIS — Z87442 Personal history of urinary calculi: Secondary | ICD-10-CM | POA: Diagnosis not present

## 2014-11-25 DIAGNOSIS — Z7952 Long term (current) use of systemic steroids: Secondary | ICD-10-CM | POA: Diagnosis not present

## 2014-11-25 DIAGNOSIS — Y998 Other external cause status: Secondary | ICD-10-CM | POA: Insufficient documentation

## 2014-11-25 DIAGNOSIS — Z862 Personal history of diseases of the blood and blood-forming organs and certain disorders involving the immune mechanism: Secondary | ICD-10-CM | POA: Insufficient documentation

## 2014-11-25 DIAGNOSIS — S0990XA Unspecified injury of head, initial encounter: Secondary | ICD-10-CM | POA: Insufficient documentation

## 2014-11-25 DIAGNOSIS — S0001XA Abrasion of scalp, initial encounter: Secondary | ICD-10-CM | POA: Diagnosis not present

## 2014-11-25 DIAGNOSIS — I10 Essential (primary) hypertension: Secondary | ICD-10-CM | POA: Insufficient documentation

## 2014-11-25 DIAGNOSIS — Y92128 Other place in nursing home as the place of occurrence of the external cause: Secondary | ICD-10-CM | POA: Insufficient documentation

## 2014-11-25 DIAGNOSIS — N400 Enlarged prostate without lower urinary tract symptoms: Secondary | ICD-10-CM | POA: Diagnosis not present

## 2014-11-25 DIAGNOSIS — W500XXA Accidental hit or strike by another person, initial encounter: Secondary | ICD-10-CM | POA: Insufficient documentation

## 2014-11-25 DIAGNOSIS — Y9389 Activity, other specified: Secondary | ICD-10-CM | POA: Insufficient documentation
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
## 2014-11-25 NOTE — ED Notes (Signed)
Wife arrived to bedside. Updated on pt's situation.

## 2014-11-25 NOTE — ED Notes (Signed)
[facility]. EMS paged to transport pt home.

## 2014-11-25 NOTE — Discharge Instructions (Signed)
°Emergency Department Resource Guide °1) Find a Doctor and Pay Out of Pocket °Although you won't have to find out who is covered by your insurance plan, it is a good idea to ask around and get recommendations. You will then need to call the office and see if the doctor you have chosen will accept you as a new patient and what types of options they offer for patients who are self-pay. Some doctors offer discounts or will set up payment plans for their patients who do not have insurance, but you will need to ask so you aren't surprised when you get to your appointment. ° °2) Contact Your Local Health Department °Not all health departments have doctors that can see patients for sick visits, but many do, so it is worth a call to see if yours does. If you don't know where your local health department is, you can check in your phone book. The CDC also has a tool to help you locate your state's health department, and many state websites also have listings of all of their local health departments. ° °3) Find a Walk-in Clinic °If your illness is not likely to be very severe or complicated, you may want to try a walk in clinic. These are popping up all over the country in pharmacies, drugstores, and shopping centers. They're usually staffed by nurse practitioners or physician assistants that have been trained to treat common illnesses and complaints. They're usually fairly quick and inexpensive. However, if you have serious medical issues or chronic medical problems, these are probably not your best option. ° °No Primary Care Doctor: °- Call [facility] at  832-8000 - they can help you locate a primary care doctor that  accepts your insurance, provides certain services, etc. °- Physician Referral Service- 1-800-533-3463 ° °Chronic Pain Problems: °Organization         Address  Phone   Notes  °[facility]  (336) 297-2271 Patients need to be referred by their primary care doctor.  ° °Medication  Assistance: °Organization         Address  Phone   Notes  °[place] [facility] [address] °[place], [place] [address] (336) 641-8030 --Must be a resident of [place] °-- Must have NO insurance coverage whatsoever (no Medicaid/ Medicare, etc.) °-- The pt. MUST have a primary care doctor that directs their care regularly and follows them in the community °  °[facility]  (866) 331-1348   °[employer]  (888) 892-1162   ° °Agencies that provide inexpensive medical care: °Organization         Address  Phone   Notes  °[facility]  (336) 832-8035   °[facility]    (336) 832-7272   °[facility] [address] °[place], [place] [address] (336) 832-4777   °[facility] [address], °[place] (336) 271-4999   °[facility]    (336) 373-0678   °[facility]    (336) 272-1050   °[facility] ° [address], [place] Phone:  (336) 832-4444, Fax:  (336) 832-4440 Hours of Operation:  9 am - 6 pm, M-F.  Also accepts Medicaid/Medicare and self-pay.  °[facility] ° [address], [place] Phone: (336) 832-3150, Fax: (336) [phone]. Hours of Operation:  8:30 am - 5:30 pm, M-F.  Also accepts Medicaid and self-pay.  °[facility] [place] [address], [place] Phone: (336) 878-6027   °[facility] [address], [place], [place] (336)723-1848, Ext. 123 Mondays & Thursdays: 7-9 AM.  First 15 patients are seen on a first come, first serve basis. °  ° °Medicaid-accepting [place] Providers: ° °Organization         Address  Phone   Notes  °[facility] [address], [place] (336) 641-2100 Also accepts self-pay patients.  °[facility] [address], [place] ° (336) 856-9996   °[facility] [address], [place]  (336) 288-8857   °[facility] [address] [place] [address], [place] (336) 299-7000   °Veita Bland [address], [place]  ° (336) 373-1557 Only accepts [place] Access Medicaid patients after they have their name applied to their card.  ° °Self-Pay (no insurance) in [place]: ° °Organization         Address  Phone   Notes  °Sickle Cell Patients, [facility] [address], [place] (336) 832-1970   °[facility] [address], [place] (336) 832-4400   °[facility] ° [address], [place] (336) 992-4800   °[facility]/Dr. Osei-Bonsu ° [address] [place] [address], [place] or [address], [place] (336) 841-8500 Phone number for both [place] and [place] locations is the same.  °[facility] [address], [place] (336) 299-0000   °[facility] [address] [place] [address], [place] or [address] (336) 852-7530 °(336) 878-2260   °[facility] [address], [place] (336) 350-1642, phone; (336) 294-5005, fax Sees patients 1st and 3rd Saturday of every month.  Must not qualify for public or private insurance (i.e. Medicaid, Medicare, [place] Health Choice, Veterans' Benefits) • Household income should be no more than 200% of the poverty level •The clinic cannot treat you if you are pregnant or think you are pregnant • Sexually transmitted diseases are not treated at the clinic.  ° ° °Dental Care: °Organization         Address  Phone  Notes  °[place] [facility] [address], [place] (336) 641-6152 Accepts children up to age 21 who are enrolled in Medicaid or [place] Health Choice; pregnant women with a Medicaid card; and children who have applied for Medicaid or [place] Health Choice, but were declined, whose parents can pay a reduced fee at time of service.  °[place]  [facility] [place]  [address], [place] (336) 641-7733 Accepts children up to age 21 who are enrolled in Medicaid or [place] Health Choice; pregnant women with a Medicaid card; and children who have applied for Medicaid or [place] Health Choice, but were declined, whose parents can pay a reduced fee at time of service.  °[facility] ° [address], [place] (336) 641-4533 Patients are seen by appointment only. Walk-ins are not accepted. [facility] will see patients 18 years of age and older. °Monday - Tuesday (8am-5pm) °Most Wednesdays (8:30-5pm) °$30 per visit, cash only  °[facility] ° [address], [place] (336) 641-4533 Patients are seen by appointment only. Walk-ins are not accepted. [facility] will see patients 18 years of age and older. °One   Wednesday Evening (Monthly: Volunteer Based).  $30 per visit, cash only  °[facility]  (919) 537-3737 for adults; Children under age 4, call Graduate Pediatric Dentistry at ([phone]) [phone]. Children aged 4-14, please call ([phone]) [phone] to request a pediatric application. ° Dental services are provided in all areas of dental care including fillings, crowns and bridges, complete and partial dentures, implants, gum treatment, root canals, and extractions. Preventive care is also provided. Treatment is provided to both adults and children. °Patients are selected via a lottery and there is often a waiting list. °  °[facility] [address], °[place] ° (336) 763-8833 [email or web address] °  °[facility] [address], [place], [place] (336)723-1848, Ext. 123 Second and Fourth Thursday of each month, opens at 6:30 AM; Clinic ends at 9 AM.  Patients are seen on a first-come first-served basis, and a limited number are seen during each clinic.  ° °[facility] ° [address], [place], [place] (336) 723-7904    Eligibility Requirements °You must have lived in [place], [place], or [place] counties for at least the last three months. °  You cannot be eligible for state or federal sponsored healthcare insurance, including Veterans Administration, Medicaid, or Medicare. °  You generally cannot be eligible for healthcare insurance through your employer.  °  How to apply: °Eligibility screenings are held every Tuesday and Wednesday afternoon from 1:00 pm until 4:00 pm. You do not need an appointment for the interview!  °[facility] [address], [place], [place] 336-631-2330   °[facility]  336-342-8273   °[facility]  336-703-3100   °[facility]  336-570-6415   ° °Behavioral Health Resources in the Community: °Intensive Outpatient Programs °Organization         Address  Phone  Notes  °[facility] [address], [place], [place] 336-878-6098   °[facility] [address], [place], [place] 336-832-9800   °[facility] [address], [place], [place] ° 336-882-2125   °[facility] [address],  °[place], [place] 1-800-853-5163 or 336-641-4981   °Substance Abuse Resources °Organization         Address  Phone  Notes  °[facility]  336-882-2125   °[facility]  336-784-9470   °[facility]  336-285-9073   °[facility]  336-845-3988   °Residential & Outpatient Substance Abuse Program  1-800-659-3381   °Psychological Services °Organization         Address  Phone  Notes  °[facility]  336- 832-9600   °[facility]  336- 378-7881   °[facility] [address], [place] 1-800-853-5163 or 336-641-4981   ° °Mobile Crisis Teams °Organization         Address  Phone  Notes  °[facility], Mobile Crisis Care Unit  1-877-626-1772   °Assertive °[facility] ° [address]  [place], [place] 336-834-9664   °Sharon DeEsch [address] °[place] 336-554-5454   ° °Self-Help/Support Groups °Organization         Address  Phone             Notes  °[facility]  - variety of support groups  336- 373-1402 Call for more information  °Narcotics Anonymous (NA), [facility] [address], °[place]  2 meetings at this location  ° °  Residential Treatment Programs Organization         Address  Phone  Notes  [facility] [address],    [place]  1-610-960-45401-(204)[phone]   [facility]  [address], [place] [phone], [place], [place] [phone]   [facility] [address], [place] [phone] Admissions: 8am-3pm M-F  [facility] [address]    [place], [place] 295-284-1324(713) [phone]   [facility] [address], [place], [place] [phone]   [facility] [address]  [place], [place] [phone]   [facility] Intensive Outpatient [address], [place], [place] [phone]   [facility] [address]  [place], [place] [phone] or [phone]   [facility] ([facility]) [address] [place], [place] [phone] Accepts Medicaid  [facility] [address]  [place] [phone] Substance Abuse/Addiction Treatment   [facility]         Address  Phone  Notes  [facility]  313-837-2652(888) [phone]   Angie FavaJulie Brannon, PhD [address], [address] [place], [place]   [phone](336) [phone] or [phone](336) [phone]   [facility]   [address] [place], [place] ([phone](336) [phone]   [facility] [address], [place], [place] [phone](336) [phone] Insurance/Medicaid/sponsorship through [facility] [address]                                    [place], [place] [phone]) [phone] Therapy/tele-psych/case    [facility] [address]   [place], [place] ([phone](336) [phone]    Dr. Lolly MustacheArfeen  [phone]) [phone]   [facility]  [facility] 1) [address], [place] 2) [address], [place] 3)  [address], [place] [phone]) [phone](336) ([phone]  [phone](336) [phone]   [facility] Child Abuse Hot[facility] 562-127-4433(336) 9206122238 or 7268833431(336) [phone] (After Hours)      Take your usual prescriptions as directed.  Wash the abraded area with soap and water at least twice a day, and cover with a clean/dry dressing.  Change the dressing whenever it becomes wet or soiled after washing the area with soap and water.  Call your regular medical doctor today to schedule a follow up appointment for a recheck within the next 24 to 48 hours.  Return to the Emergency Department immediately if worsening.

## 2014-11-25 NOTE — ED Notes (Signed)
REMS at bedside.

## 2014-11-25 NOTE — ED Notes (Signed)
EMS called for transport [facility].
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
## 2015-03-31 DEATH — deceased

## 2015-05-17 IMAGING — CR DG ABDOMEN 1V
1 series · 1 of 1 positions shown · non-contrast
Comparison: CT 06/29/2014

CLINICAL DATA: Small bowel obstruction, dementia

EXAM:
ABDOMEN - 1 VIEW

[ap portable]
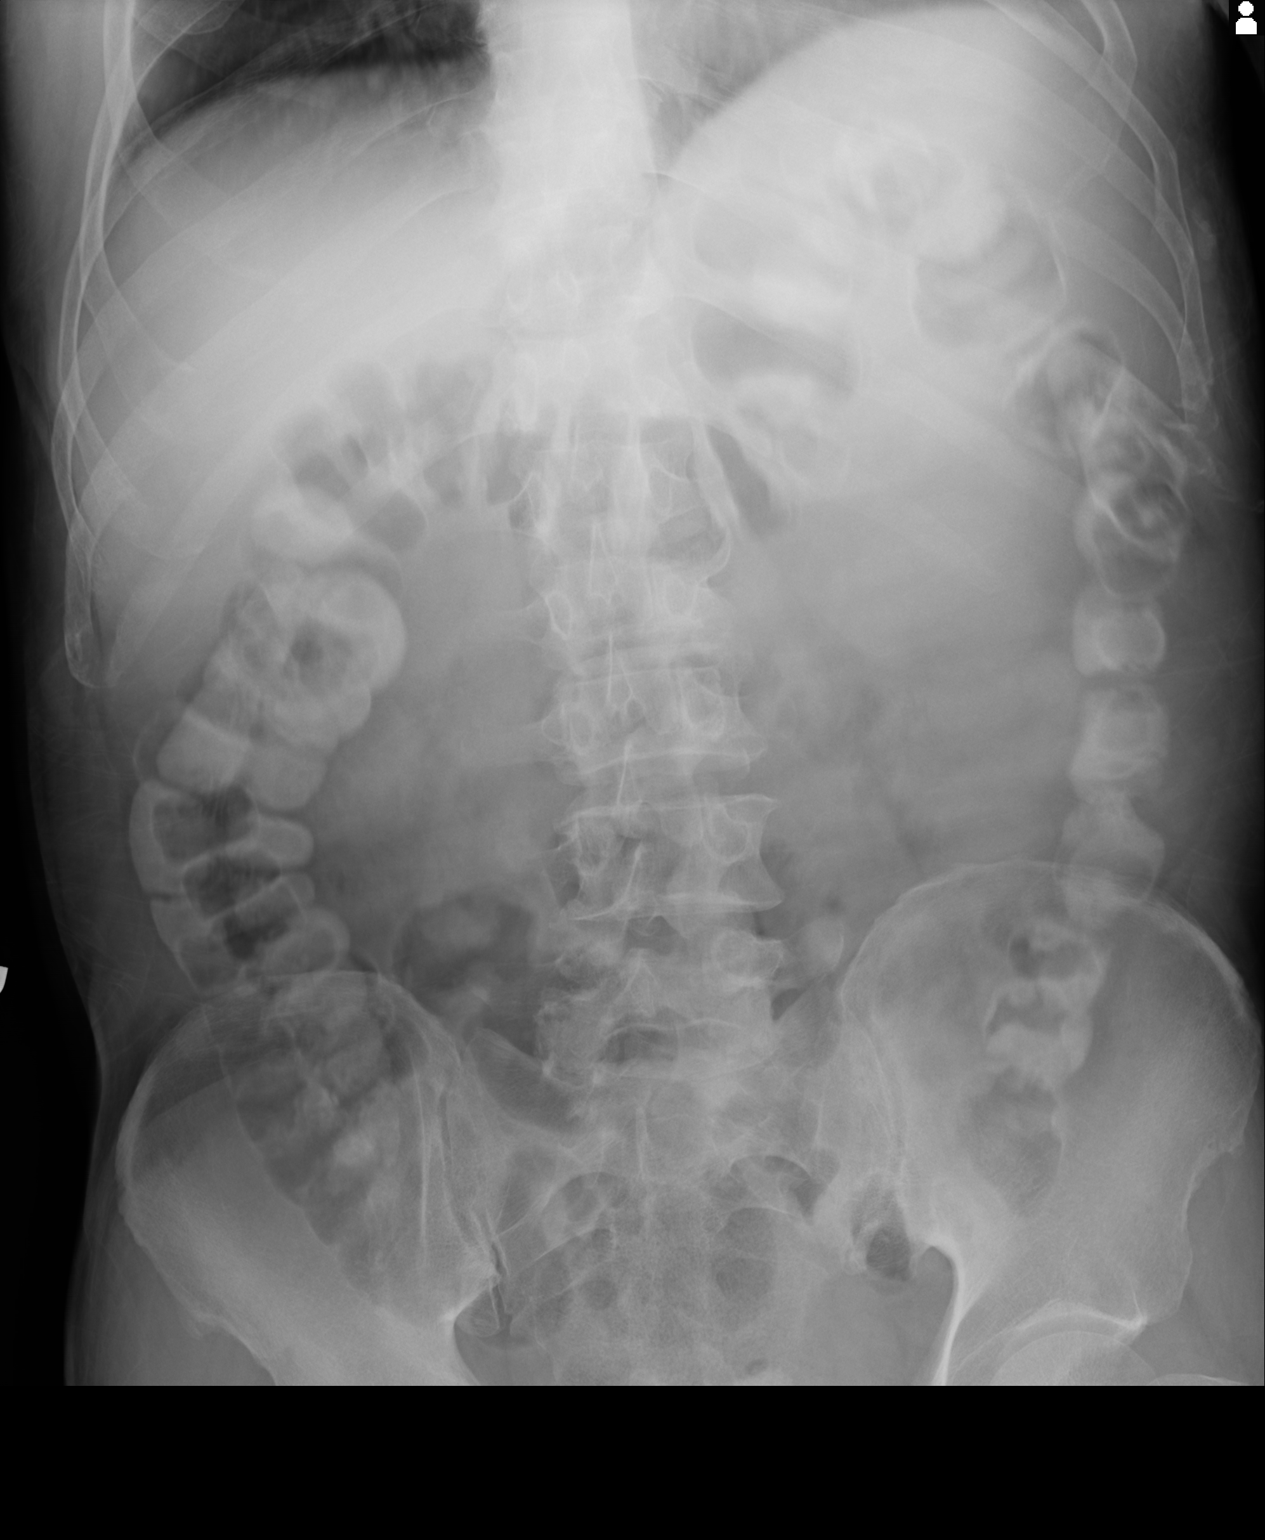

[1 of 1 positions shown; findings below may reference images not displayed]

FINDINGS: Stomach and small bowel appear decompressed. Progression of enteral
contrast material into the decompressed colon.

Bladder calculus.

Regional bones unremarkable.
IMPRESSION: 1. Nonobstructive bowel gas pattern.
2. Bladder calculus.
